# Patient Record
Sex: Male | Born: 1955 | Race: Asian | Hispanic: No | Marital: Married | Smoking: Never smoker
Health system: Southern US, Community
[De-identification: ages and names within clinical notes are randomized; demographics above are authoritative.]

## PROBLEM LIST (undated history)

## (undated) DIAGNOSIS — J45909 Unspecified asthma, uncomplicated: Secondary | ICD-10-CM

## (undated) DIAGNOSIS — E119 Type 2 diabetes mellitus without complications: Secondary | ICD-10-CM

## (undated) DIAGNOSIS — N200 Calculus of kidney: Secondary | ICD-10-CM

## (undated) HISTORY — PX: OTHER SURGICAL HISTORY: SHX169

## (undated) HISTORY — DX: Unspecified asthma, uncomplicated: J45.909

---

## 2000-12-21 ENCOUNTER — Encounter: Payer: Self-pay | Admitting: Family Medicine

## 2000-12-21 ENCOUNTER — Ambulatory Visit (HOSPITAL_COMMUNITY): Admission: RE | Admit: 2000-12-21 | Discharge: 2000-12-21 | Payer: Self-pay | Admitting: Family Medicine

## 2003-09-23 ENCOUNTER — Emergency Department (HOSPITAL_COMMUNITY): Admission: EM | Admit: 2003-09-23 | Discharge: 2003-09-23 | Payer: Self-pay | Admitting: Emergency Medicine

## 2004-08-19 ENCOUNTER — Ambulatory Visit (HOSPITAL_COMMUNITY): Admission: RE | Admit: 2004-08-19 | Discharge: 2004-08-19 | Payer: Self-pay | Admitting: Orthopaedic Surgery

## 2008-08-13 ENCOUNTER — Ambulatory Visit (HOSPITAL_COMMUNITY): Admission: RE | Admit: 2008-08-13 | Discharge: 2008-08-13 | Payer: Self-pay | Admitting: Family Medicine

## 2010-12-31 NOTE — H&P (Signed)
NAMEHILARY, Rodney Meyers              ACCOUNT NO.:  [id number]   MEDICAL RECORD NO.:  [id number]           PATIENT TYPE:   LOCATION:                                FACILITY:  [facility]   PHYSICIAN:  J. Darreld Mclean, M.D.      DATE OF BIRTH:   DATE OF ADMISSION:  DATE OF DISCHARGE:  LH                                HISTORY & PHYSICAL   CHIEF COMPLAINT:  Carpal tunnel syndrome, left hand.   HISTORY OF PRESENT ILLNESS:  The patient is a 55 year old Pakistani who has  carpal tunnel syndrome in his left hand.  He was first seen by Dr. Nobie Putnam,  in November, because of complaints of numbness and tingling in his left  hand.  It was felt by Dr. Nobie Putnam that he had a carpal tunnel syndrome.  He  was then referred to Dr. Hilda Lias for further evaluation of this complaint.  The patient eventually underwent nerve conduction lysis at Dr. 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  He had also mild median neuropathy at the wrist  on the right.  The patient has tried a cock-up splint, antiinflammatory  medication.  This failed to improve with conservative measures.  He now  desires surgery.   PAST MEDICAL HISTORY:  Irritable bowel syndrome.  He denies diabetes  mellitus, stroke, cardiac or respiratory problems.   OPERATIONS:  1.  Repair of fistula and hemorrhoidectomy in 1986, [facility].  2.  Release of lateral __________, left elbow, 1996, Dr. Hilda Lias.   MEDICATIONS:  Mucinex one tab daily occasionally for upper respiratory  infection.   ALLERGIES:  None.   LOCAL MEDICAL DOCTOR:  Patrica Duel, M.D.   FAMILY AND SOCIAL HISTORY:  The patient works at [employer].  He has a  high school education plus two years of college.  He does not use alcoholic  beverages.  He smokes tobacco.  He is single.   REVIEW OF SYSTEMS:  Noncontributory to the problem.   PHYSICAL EXAMINATION:  VITAL SIGNS:  The patient  is 5 foot and 7 inches tall  and weighs 175 pounds.  He is alert and oriented x 3.  VITAL SIGNS:  Temp is 98.6, pulse 68, respirations 16, blood pressure  110/72.  HEENT:  Within normal limits.  NECK:  Supple.  No thyromegaly or masses palpated.  LUNGS:  Clear to A&P.  HEART:  Regular rhythm without murmur.  No cardiomegaly.  ABDOMEN:  Slightly protuberant, soft, and nontender.  No organomegaly or  masses palpated.  Hypoactive bowel sounds auscultated.  EXTREMITIES:  The left hand has a positive Tinel and phalanx signs.  He had  full range of motion of the wrist and hands.  Neurovascular is intact.  Examination of the right wrist revealed full range of motion, is less  symptomatic.  Moves left hand.  He had a negative Tinel and phalanx signs in  his right hand.  Neurovascular is intact.  Other extremities normal limits.  Neurovascular is intact  to them.  CNS:  Intact.  SKIN:  Intact.   IMPRESSION:  1.  Bilateral carpal tunnel syndrome, left greater than right.  2.  History of irritable bowel syndrome.   PLAN:  The patient is to undergo carpal tunnel release of the left hand on  August 04, 2004.   LABS:  Pending.      BC/MEDQ  D:  07/27/2004  T:  07/27/2004  Job:  [id number]   cc:   Patrica Duel, M.D.  [address]  [place]  [place] [address]  Fax: [phone]

## 2010-12-31 NOTE — Op Note (Signed)
NAME:  Rodney Meyers, Rodney Meyers              ACCOUNT NO.:  [id number]   MEDICAL RECORD NO.:  [id number]          PATIENT TYPE:  AMB   LOCATION:  DAY                           FACILITY:  [facility]   PHYSICIAN:  J. Darreld Mclean, M.D. DATE OF BIRTH:  01-03-1956   DATE OF PROCEDURE:  08/19/2004  DATE OF DISCHARGE:                                 OPERATIVE REPORT   PREOPERATIVE DIAGNOSIS:  Carpal tunnel syndrome, left.   POSTOPERATIVE DIAGNOSIS:  Carpal tunnel syndrome, left.   OPERATION PERFORMED:  Left carpal tunnel release.   SURGEON:  J. Darreld Mclean, M.D.   ANESTHESIA:  Bier block.   TOURNIQUET TIME:  Please refer to anesthesia record.  Volar plaster splint applied.   DRAINS:  None.   Specimen sent.   INDICATIONS FOR PROCEDURE:  The patient had pain and tenderness in the  median nerve distribution.  Nerve conduction studies by Kofi A. Doonquah,  M.D. showed moderately severe carpal tunnel syndrome on the left with mild  median neuropathy on the right.  The patient has tried cock-up splints, anti-  inflammatories, conservative treatment was not successful.  He now desires  surgery.  The risks and imponderables  have been discussed.   DESCRIPTION OF PROCEDURE:  The patient was seen in the holding area.  We  identified the patient and the left wrist was the site of the surgical  procedure.  He placed a marker and I placed a marker.  He was brought back  to the operating room and was given Bier block anesthesia.  He was prepped  and draped in the usual manner.  We again re-identified the patient and  identified surgical site with a time out.  __________ incision made over the  volar wrist.  Median nerve was identified proximally.  Vessel loop placed  around the nerve.  A groove director then placed with the carpal tunnel  space and the volar carpal ligament was incised.  Specimen volar carpal  ligament was sent to pathology.  Retinaculum was cut dorsally.  Nerve was  markedly compressed.   A linear lysis epineurotomy carried out.  Wound  reinspected, nerve reinspected, no apparent injury.  Wound was  reapproximated with 3-0 nylon in interrupted vertical mattress manner.  Sterile dressing applied.  Sheet cotton applied, sheet cotton cut dorsally.  Volar plaster splint applied.  Ace bandage applied loosely.  The patient  tolerated the procedure well.  Bier block was discontinued in the usual  fashion.  Please refer to anesthesia record for tourniquet time.   Patient given prescription for Vicodin ES for pain and will him in the  office in approximately 10 days to two weeks.  Any difficulty, contact me  through the office hospital beeper system.  Numbers have been provided.      JWK/MEDQ  D:  08/19/2004  T:  08/19/2004  Job:  [id number]

## 2012-12-14 ENCOUNTER — Ambulatory Visit (INDEPENDENT_AMBULATORY_CARE_PROVIDER_SITE_OTHER): Payer: Managed Care, Other (non HMO) | Admitting: Family Medicine

## 2012-12-14 ENCOUNTER — Encounter: Payer: Self-pay | Admitting: Family Medicine

## 2012-12-14 VITALS — BP 128/70 | Temp 98.3°F | Wt 179.0 lb

## 2012-12-14 DIAGNOSIS — J322 Chronic ethmoidal sinusitis: Secondary | ICD-10-CM

## 2012-12-14 DIAGNOSIS — J45909 Unspecified asthma, uncomplicated: Secondary | ICD-10-CM

## 2012-12-14 MED ORDER — AMOXICILLIN-POT CLAVULANATE 875-125 MG PO TABS: 1.0000 | ORAL_TABLET | Freq: Two times a day (BID) | ORAL | Status: AC

## 2012-12-14 NOTE — Progress Notes (Signed)
  Subjective:    Patient ID: Rodney Meyers, male    DOB: September 02, 1955, 57 y.o.   MRN: [id number]  HPI Patient arrives office feeling poorly. Frontal headache. Persistent in nature. Nasal congestion. States he does not get allergies much. This has not flared up his asthma. Unresponsive to prior headaches. Feels like it never completely went away. A lot of stress with son recently diagnosed with leukemia   Review of Systems No rash, no vomiting, no diarrhea, no chest pain no abdominal pain ROS otherwise negative    Objective:   Physical Exam  Alert no acute distress. Lungs clear. Heart regular rate and rhythm. HEENT moderate nasal and sinus congestion. Frontal tenderness. Pharynx normal.      Assessment & Plan:  Impression persistent sinusitis-discussed. Plan Augmentin twice a day 20 one full days. Symptomatic care discussed. WSL

## 2012-12-16 DIAGNOSIS — J45909 Unspecified asthma, uncomplicated: Secondary | ICD-10-CM | POA: Insufficient documentation

## 2012-12-18 ENCOUNTER — Encounter: Payer: Self-pay | Admitting: *Deleted

## 2013-07-04 ENCOUNTER — Telehealth: Payer: Self-pay | Admitting: Family Medicine

## 2013-07-04 NOTE — Telephone Encounter (Signed)
PT was seen by his work Charity fundraiser (see attached note on chart), she feels he may have an issue with diabetes. Pt wants further testing and your advice on this matter. He has had excessive thirst, urination, blurry vision. No night thirst, no night sweats, etc.    Contact patient to let him know if he needs just fasting blood work or an appt. Thanks

## 2013-07-04 NOTE — Telephone Encounter (Signed)
Needs ov plus hbA1c next wk, fri ok, cut glucose down in the mean time

## 2013-07-05 NOTE — Telephone Encounter (Signed)
Notified patient's wife needs ov plus hbA1c next wk, fri ok, cut glucose down in the mean time. Transferred her to front desk to schedule appointment for patient.

## 2013-07-12 ENCOUNTER — Encounter: Payer: Self-pay | Admitting: Family Medicine

## 2013-07-12 ENCOUNTER — Ambulatory Visit (INDEPENDENT_AMBULATORY_CARE_PROVIDER_SITE_OTHER): Payer: Managed Care, Other (non HMO) | Admitting: Family Medicine

## 2013-07-12 VITALS — BP 146/80 | Ht 67.0 in | Wt 179.8 lb

## 2013-07-12 DIAGNOSIS — Z23 Encounter for immunization: Secondary | ICD-10-CM

## 2013-07-12 DIAGNOSIS — Z125 Encounter for screening for malignant neoplasm of prostate: Secondary | ICD-10-CM

## 2013-07-12 DIAGNOSIS — Z0000 Encounter for general adult medical examination without abnormal findings: Secondary | ICD-10-CM

## 2013-07-12 DIAGNOSIS — R739 Hyperglycemia, unspecified: Secondary | ICD-10-CM

## 2013-07-12 DIAGNOSIS — R7309 Other abnormal glucose: Secondary | ICD-10-CM

## 2013-07-12 DIAGNOSIS — E119 Type 2 diabetes mellitus without complications: Secondary | ICD-10-CM

## 2013-07-12 LAB — POCT GLYCOSYLATED HEMOGLOBIN (HGB A1C): Hemoglobin A1C: 10.2

## 2013-07-12 MED ORDER — METFORMIN HCL 500 MG PO TABS: ORAL_TABLET | ORAL | Status: DC

## 2013-07-12 NOTE — Progress Notes (Signed)
   Subjective:    Patient ID: Rodney Meyers, male    DOB: Aug 05, 1956, 57 y.o.   MRN: [id number]  HPI PT was seen by his work RN she feels he may have an issue with diabetes. His blood sugar was 252 (fasting)  Pt wants further testing and your advice on this matter. He has had excessive thirst, urination, blurry vision (in the past). No night thirst, no night sweats, etc.  Staying busy with   freq urination  Was eating too much candy, Soft drinks with sugar in them,  There is no family history diabetes.  Recently patient has had tremendous stress with son diagnosed as having leukemia and also long hours at work.  Patient some visual changes when got glasses several months ago.  Admits to not exercising. Realizes that he is gaining too much weight.  History of elevated blood pressure in the past. History of asthma clinically stable at this time.  Review of Systems Possible slight weight loss. Increased urinary frequency no shortness breath no headache no chest pain no abdominal pain no change in bowel habits ROS otherwise negative    Objective:   Physical Exam Alert HEENT normal. Blood pressure 1:30 or 78 on repeat. Lungs clear. Heart regular in rhythm. Abdomen benign. Ankles without edema.  See diabetic foot exam     Assessment & Plan:  Impression new onset type 2 diabetes discussed at great length. A1c elevated. Results for orders placed in visit on 07/12/13  POCT GLYCOSYLATED HEMOGLOBIN (HGB A1C)      Result Value Range   Hemoglobin A1C 10.2     Nature of diabetes discussed at great length. Long-term complications discussed. Need for intervention discussed. Plan glucometer prescribed. Check glucoses every morning. Metformin 500 twice a day for 1 week then 2 twice a day. Diet discussed length. Educational information given. Exercise discussed in encourage to length. Educational session the hospital discussed in encourage. Blood pressure parameters lipid parameters  discussed. Long-term risk of heart disease discussed. Side effects of medication discussed. 40 minutes spent with patient most in discussion. Recheck in a little over one month. WSL

## 2013-07-13 DIAGNOSIS — E119 Type 2 diabetes mellitus without complications: Secondary | ICD-10-CM | POA: Insufficient documentation

## 2013-07-15 DIAGNOSIS — Z0289 Encounter for other administrative examinations: Secondary | ICD-10-CM

## 2013-07-17 LAB — MICROALBUMIN, URINE: Microalb, Ur: 1.98 mg/dL — ABNORMAL HIGH (ref 0.00–1.89)

## 2013-07-17 LAB — LIPID PANEL
Cholesterol: 196 mg/dL (ref 0–200)
HDL: 36 mg/dL — ABNORMAL LOW (ref >39)
LDL Cholesterol: 124 mg/dL — ABNORMAL HIGH (ref 0–99)
Total CHOL/HDL Ratio: 5.4 Ratio
Triglycerides: 179 mg/dL — ABNORMAL HIGH (ref <150)
VLDL: 36 mg/dL (ref 0–40)

## 2013-07-17 LAB — HEPATIC FUNCTION PANEL
ALT: 30 U/L (ref 0–53)
AST: 28 U/L (ref 0–37)
Albumin: 4.6 g/dL (ref 3.5–5.2)
Alkaline Phosphatase: 87 U/L (ref 39–117)
Bilirubin, Direct: 0.1 mg/dL (ref 0.0–0.3)
Indirect Bilirubin: 0.5 mg/dL (ref 0.0–0.9)
Total Bilirubin: 0.6 mg/dL (ref 0.3–1.2)
Total Protein: 7.5 g/dL (ref 6.0–8.3)

## 2013-07-17 LAB — BASIC METABOLIC PANEL
BUN: 18 mg/dL (ref 6–23)
CO2: 25 mEq/L (ref 19–32)
Calcium: 9.4 mg/dL (ref 8.4–10.5)
Chloride: 102 mEq/L (ref 96–112)
Creat: 1.17 mg/dL (ref 0.50–1.35)
Glucose, Bld: 155 mg/dL — ABNORMAL HIGH (ref 70–99)
Potassium: 4.3 mEq/L (ref 3.5–5.3)
Sodium: 137 mEq/L (ref 135–145)

## 2013-07-17 LAB — PSA: PSA: 0.56 ng/mL (ref <=4.00)

## 2013-07-19 ENCOUNTER — Telehealth: Payer: Self-pay | Admitting: Family Medicine

## 2013-07-19 NOTE — Telephone Encounter (Signed)
Discussed with wife. She will call back next week to schedule an appt.

## 2013-07-19 NOTE — Telephone Encounter (Signed)
Kid and liv function fine . psa good. chol too high will discuss further at f u visit

## 2013-07-19 NOTE — Telephone Encounter (Signed)
Patient needs results to bloodwork

## 2013-07-22 ENCOUNTER — Telehealth: Payer: Self-pay | Admitting: Family Medicine

## 2013-07-22 NOTE — Telephone Encounter (Signed)
Patient is having issues with newly prescribed metformin. He is urinating a lot and wife describes "loss of motion". Please advise.

## 2013-07-22 NOTE — Telephone Encounter (Signed)
Metformin does not caswue "loss of motion". Urinating fast because sugars are too high, this med does not cause incr urination. incr urination comes because glucose when too high spills out the kidneys and takes water with it. rec ov later this wk and stay on same med

## 2013-07-22 NOTE — Telephone Encounter (Signed)
Notified patient and transferred up front for appt. 

## 2013-07-23 ENCOUNTER — Encounter: Payer: Self-pay | Admitting: Family Medicine

## 2013-07-23 ENCOUNTER — Ambulatory Visit (INDEPENDENT_AMBULATORY_CARE_PROVIDER_SITE_OTHER): Payer: Managed Care, Other (non HMO) | Admitting: Family Medicine

## 2013-07-23 VITALS — BP 122/84 | Temp 98.0°F | Ht 67.0 in | Wt 178.5 lb

## 2013-07-23 DIAGNOSIS — E785 Hyperlipidemia, unspecified: Secondary | ICD-10-CM

## 2013-07-23 DIAGNOSIS — K219 Gastro-esophageal reflux disease without esophagitis: Secondary | ICD-10-CM

## 2013-07-23 MED ORDER — METFORMIN HCL 500 MG PO TABS: ORAL_TABLET | ORAL | Status: DC

## 2013-07-23 MED ORDER — GLIPIZIDE 5 MG PO TABS: 5.0000 mg | ORAL_TABLET | ORAL | Status: DC

## 2013-07-23 NOTE — Patient Instructions (Signed)
Fasting sugar goal is 70 to 130 in the morning. After eating is not very helpful, it will be high as long as fasting numbers are good and we get the hgba1c back down to 7 we'll be in great control

## 2013-07-23 NOTE — Progress Notes (Signed)
   Subjective:    Patient ID: Rodney Meyers, male    DOB: [birth year], 57 y.o.   MRN: [id number]  Diarrhea  This is a new problem. The current episode started in the past 7 days. The problem has been unchanged. Associated symptoms include bloating. Associated symptoms comments: indigestion. Exacerbated by: metformin. There are no known risk factors. He has tried nothing for the symptoms. The treatment provided no relief.   Patient noted this occurring after he went to 2 metformin twice a day.  Sugar control is improving considerably. Most morning sugars are 120 to 1:30 on to twice a day.  Patient does have some mild abdominal cramping with the metformin.  Patient also notes considerable difficulty with reflux. He recently started taking Zantac 150 mg daily or twice daily as needed for reflux. He notes that this has improved his situation considerably. Patient having substernal burping belching sour taste in mouth.  Also wonders about results of blood work. See below.  Review of Systems  Gastrointestinal: Positive for diarrhea and bloating.   No chest pain no shortness of breath no blood in stools no weight loss weight gain, vision starting to worsen as glucose is improved ROS otherwise negative    Objective:   Physical Exam Alert HEENT normal. Lungs clear. Heart regular in rhythm. Ankles without edema sensation intact pulses good. Abdominal exam no discrete tenderness good bowel sounds no rebound no guarding       Assessment & Plan:  Impression #1 type 2 diabetes unfortunately side effects from current medication regimen discussed at length. #2 hyperlipidemia see blood work results 2 hyper patient discuss patient to work on diet. #3 reflux. Discussed would prefer to stick with Zantac if that works for him. #4 diarrhea secondary to to metformin twice a day plan add glipizide 5 every morning. Increase metformin to 1 twice a day rationale discussed. Do not skip meals. Hypoglycemia  reviewed. Add ranitidine twice a day. Followup as scheduled in 6 weeks. WSL

## 2013-07-24 DIAGNOSIS — K219 Gastro-esophageal reflux disease without esophagitis: Secondary | ICD-10-CM | POA: Insufficient documentation

## 2013-07-24 DIAGNOSIS — E782 Mixed hyperlipidemia: Secondary | ICD-10-CM | POA: Insufficient documentation

## 2013-07-24 DIAGNOSIS — E785 Hyperlipidemia, unspecified: Secondary | ICD-10-CM | POA: Insufficient documentation

## 2013-07-25 ENCOUNTER — Telehealth: Payer: Self-pay | Admitting: Family Medicine

## 2013-07-25 NOTE — Telephone Encounter (Signed)
Patient needing paper work. They were sent back 12/1 for signature . They havent came back to me yet.

## 2013-08-21 ENCOUNTER — Ambulatory Visit: Payer: Managed Care, Other (non HMO) | Admitting: Family Medicine

## 2013-09-03 ENCOUNTER — Encounter: Payer: Self-pay | Admitting: Family Medicine

## 2013-09-03 ENCOUNTER — Ambulatory Visit (INDEPENDENT_AMBULATORY_CARE_PROVIDER_SITE_OTHER): Payer: Managed Care, Other (non HMO) | Admitting: Family Medicine

## 2013-09-03 VITALS — BP 112/74 | Ht 67.0 in | Wt 175.0 lb

## 2013-09-03 DIAGNOSIS — J45909 Unspecified asthma, uncomplicated: Secondary | ICD-10-CM

## 2013-09-03 DIAGNOSIS — E119 Type 2 diabetes mellitus without complications: Secondary | ICD-10-CM

## 2013-09-03 LAB — POCT GLYCOSYLATED HEMOGLOBIN (HGB A1C): Hemoglobin A1C: 7.7

## 2013-09-03 MED ORDER — FLUTICASONE PROPIONATE HFA 110 MCG/ACT IN AERO: 2.0000 | INHALATION_SPRAY | Freq: Two times a day (BID) | RESPIRATORY_TRACT | Status: DC

## 2013-09-03 MED ORDER — METFORMIN HCL 1000 MG PO TABS: ORAL_TABLET | ORAL | Status: DC

## 2013-09-03 NOTE — Progress Notes (Signed)
   Subjective:    Patient ID: Raynelle JanSaif U Frosch, male    DOB: 01/06/1956, 58 y.o.   MRN: [id number]  HPIDiabetes check up. Pt states fasting blood sugars running between 84 - 120. A1C today 7.7.  Taking only one half tablet twice per day. Notes his sugars still generally in good control. Has noted some blurred vision as his sugars have improved.  Watching diet closely. Now exercising several times a week. Has lost 10 pounds.  No obvious side effects from the medication. Energy level overall considerably improved.   Having more difficulties with asthma. Notes wheezing multiple times every day. Takes 2 albuterol treatments per day on average.   Review of Systems No chest pain no back pain no vomiting no change in bowel habits no blood in stool no rash ROS otherwise negative    Objective:   Physical Exam Alert HEENT slight nasal congestion neck supple. Lungs bilateral wheezes mild no tachypnea heart regular rate and rhythm. Ankles without edema.       Assessment & Plan:  Impression type 2 diabetes control much improved. #2 asthma concerning for chronic persistent symptomatology. Discussed at length. We will initiate preventive agent. Plan start Flovent 110 per puff 2 puffs twice a day. Maintain metformin. Patient has stopped her Glucotrol stay off. Diet exercise discussed recheck here in 4 months. WSL

## 2014-04-09 ENCOUNTER — Telehealth: Payer: Self-pay | Admitting: Family Medicine

## 2014-04-09 MED ORDER — METFORMIN HCL 1000 MG PO TABS: ORAL_TABLET | ORAL | Status: DC

## 2014-04-09 NOTE — Telephone Encounter (Signed)
Send in 30 day supply to pharmacy. Patient needs office visit. Patient was transferred to front desk to schedule appointment.

## 2014-04-09 NOTE — Telephone Encounter (Signed)
Needs 90 day Rx for his metFORMIN (GLUCOPHAGE) 1000 MG tablet [facility] (changing pharmacies due to new insurance) Please call pt when done

## 2014-04-23 ENCOUNTER — Ambulatory Visit (INDEPENDENT_AMBULATORY_CARE_PROVIDER_SITE_OTHER): Payer: BC Managed Care – PPO | Admitting: Family Medicine

## 2014-04-23 ENCOUNTER — Encounter: Payer: Self-pay | Admitting: Family Medicine

## 2014-04-23 VITALS — BP 122/80 | Ht 67.0 in | Wt 175.0 lb

## 2014-04-23 DIAGNOSIS — E118 Type 2 diabetes mellitus with unspecified complications: Secondary | ICD-10-CM

## 2014-04-23 LAB — POCT GLYCOSYLATED HEMOGLOBIN (HGB A1C): HEMOGLOBIN A1C: 6.6

## 2014-04-23 MED ORDER — METFORMIN HCL 1000 MG PO TABS: ORAL_TABLET | ORAL | Status: DC

## 2014-04-23 MED ORDER — FLUTICASONE PROPIONATE HFA 110 MCG/ACT IN AERO: 2.0000 | INHALATION_SPRAY | Freq: Two times a day (BID) | RESPIRATORY_TRACT | Status: DC

## 2014-04-23 NOTE — Patient Instructions (Signed)
mylanta one or two tablespoons as needed for heartburn

## 2014-04-23 NOTE — Progress Notes (Signed)
   Subjective:    Patient ID: Rodney Meyers, male    DOB: 04-13-1956, 58 y.o.   MRN: [id number]  Diabetes He presents for his follow-up diabetic visit. He has type 2 diabetes mellitus. Current diabetic treatment includes oral agent (monotherapy). He is compliant with treatment all of the time. He sees a podiatrist.Eye exam is current.   Concerns about reflux. Twenty min or so, goes away and doen. Usually after certain meals. Accompanied by belching and bad taste in throat.  Glu numbers many low 100s. No low sugar spells.  Mostly trying to watch his diet.  Compliant with the preventive asthma medicine. The often uses Flovent only once per day.  Watching sweets   exercise not exercising because back acting up  Has not seen an eye doc yet this yr   Review of Systems No headache no chest pain no back pain no abdominal pain no change in bowel habits no blood in stool ROS otherwise negative    Objective:   Physical Exam Alert no apparent distress. Lungs clear. Heart regular in rhythm H&T normal. Ankles edema. C. diabetic foot exam.       Assessment & Plan:  Impression 1 type 2 diabetes good control.this Results for orders placed in visit on 04/23/14  POCT GLYCOSYLATED HEMOGLOBIN (HGB A1C)      Result Value Ref Range   Hemoglobin A1C 6.6     #2 asthma clinically stable. #3 reflux discussed plan Maalox when necessary. Diet exercise discussed. Encouraged to see eye doctor. Recheck in 6 months. Blood work then. Flu vaccine encourage. WSL

## 2014-05-02 ENCOUNTER — Telehealth: Payer: Self-pay | Admitting: Family Medicine

## 2014-05-02 NOTE — Telephone Encounter (Signed)
Pt's FLOVENT was DENIED due to criteria not being met, please see denial in green folder, pt must try & fail Asmanex or Qvar, please advise

## 2014-05-06 MED ORDER — BECLOMETHASONE DIPROPIONATE 40 MCG/ACT IN AERS: 2.0000 | INHALATION_SPRAY | Freq: Two times a day (BID) | RESPIRATORY_TRACT | Status: DC

## 2014-05-06 NOTE — Telephone Encounter (Signed)
Received another prio auth request, this has already been denied, please see note below, please advise

## 2014-05-06 NOTE — Telephone Encounter (Signed)
qvar 40 mcg per puff two puffs bid

## 2014-05-06 NOTE — Telephone Encounter (Signed)
Medication sent to pharmacy  

## 2014-05-09 ENCOUNTER — Telehealth: Payer: Self-pay | Admitting: Family Medicine

## 2014-05-09 NOTE — Telephone Encounter (Signed)
[facility] said [facility] [place] [facility] and notified him that our office needed to contact them in order for him to have his medications filled. Please advise.

## 2014-05-09 NOTE — Telephone Encounter (Signed)
Notified patient that medications were sent to pharmacy earlier this month with 5 additional refills.

## 2014-05-29 ENCOUNTER — Other Ambulatory Visit: Payer: Self-pay | Admitting: *Deleted

## 2014-05-29 ENCOUNTER — Telehealth: Payer: Self-pay | Admitting: Family Medicine

## 2014-05-29 MED ORDER — METFORMIN HCL 1000 MG PO TABS: ORAL_TABLET | ORAL | Status: DC

## 2014-05-29 NOTE — Telephone Encounter (Signed)
Med sent to pharm. Pt's wife notified. Pt has not had bloodwork done here since last December. Wife to drop off bloodwork that he had done 2 weeks ago.

## 2014-05-29 NOTE — Telephone Encounter (Signed)
ok 

## 2014-05-29 NOTE — Telephone Encounter (Signed)
Patient needs Rx for metFORMIN (GLUCOPHAGE) 1000 MG tablet to [facility] [place].  Also, patients spouse said that he had a cholesterol 2 weeks ago with Dr. Brett CanalesSteve and wanted to check if any medicine needed to be called in.

## 2014-06-04 ENCOUNTER — Telehealth: Payer: Self-pay | Admitting: Family Medicine

## 2014-06-04 NOTE — Telephone Encounter (Signed)
See chart for BW from his place of employment

## 2014-06-06 NOTE — Telephone Encounter (Signed)
Pt is calling to see if you looked over this report an does he need an appt or be put on any meds

## 2014-06-09 ENCOUNTER — Other Ambulatory Visit: Payer: Self-pay | Admitting: *Deleted

## 2014-06-09 DIAGNOSIS — E785 Hyperlipidemia, unspecified: Secondary | ICD-10-CM

## 2014-06-09 DIAGNOSIS — Z79899 Other long term (current) drug therapy: Secondary | ICD-10-CM

## 2014-06-09 MED ORDER — PRAVASTATIN SODIUM 20 MG PO TABS: 20.0000 mg | ORAL_TABLET | Freq: Every day | ORAL | Status: DC

## 2014-06-09 NOTE — Telephone Encounter (Signed)
pravochol 20 mg numb thirty five ref , will re ck bw after three mo on therapy and then every six mo after that

## 2014-06-09 NOTE — Telephone Encounter (Signed)
Calling again to check on this. Please advise.

## 2014-06-09 NOTE — Telephone Encounter (Signed)
Discussed with patient. Patient states he has been working on diet and exercise.pt states Lipid was high last year and he wants to go ahead and start on meds now if you will prescribed.

## 2014-06-09 NOTE — Telephone Encounter (Signed)
Call pt, kid liv thyr and psa all good, lipids not good needs to work harder on fats in the diet, re ck this agin in 6 mo, if not sig better will initiate meds. No need for another visit at this time

## 2014-06-09 NOTE — Telephone Encounter (Signed)
Discussed with pt. Med [facility] to [facility] [place]. Orders in for lipid, liver in 3 months.

## 2014-06-18 ENCOUNTER — Ambulatory Visit (INDEPENDENT_AMBULATORY_CARE_PROVIDER_SITE_OTHER): Payer: BC Managed Care – PPO | Admitting: Family Medicine

## 2014-06-18 ENCOUNTER — Encounter: Payer: Self-pay | Admitting: Family Medicine

## 2014-06-18 VITALS — BP 144/90 | Temp 97.6°F | Ht 67.0 in | Wt 177.0 lb

## 2014-06-18 DIAGNOSIS — L03211 Cellulitis of face: Secondary | ICD-10-CM

## 2014-06-18 DIAGNOSIS — L0201 Cutaneous abscess of face: Secondary | ICD-10-CM

## 2014-06-18 MED ORDER — MOMETASONE FUROATE 0.1 % EX SOLN: Freq: Every day | CUTANEOUS | Status: DC

## 2014-06-18 MED ORDER — AMOXICILLIN-POT CLAVULANATE 875-125 MG PO TABS: 1.0000 | ORAL_TABLET | Freq: Two times a day (BID) | ORAL | Status: AC

## 2014-06-18 MED ORDER — OFLOXACIN 0.3 % OT SOLN: 5.0000 [drp] | Freq: Two times a day (BID) | OTIC | Status: DC

## 2014-06-18 NOTE — Progress Notes (Signed)
   Subjective:    Patient ID: Rodney Meyers, male    DOB: May 08, 1956, 58 y.o.   MRN: [id number]  HPI Patient is here today d/t right, ear pain.started rather suddenly  Hurts ear lobe a lot  Patient wears earplugs chronically at work and often develops an irritated itchy area and right external ear canal.   This started on Friday.  He said he has red drainage.  No other s/s. No tx.     Review of Systems No headache no cough no sore throat no chest pain no fever ROS otherwise negative    Objective:   Physical Exam Alert slight distress right earlobe noticeably swollen tender chronic eczema changes outer canal with cracking skin tympanic membrane normal pharynx neck lungs heart all normal       Assessment & Plan:  Impression cellulitis with underlying eczema plan topical and oral antibiotics initially. Elocon lotion long-term discussed WSL

## 2014-06-24 ENCOUNTER — Encounter: Payer: Self-pay | Admitting: Family Medicine

## 2014-06-24 ENCOUNTER — Ambulatory Visit (INDEPENDENT_AMBULATORY_CARE_PROVIDER_SITE_OTHER): Payer: BC Managed Care – PPO | Admitting: Family Medicine

## 2014-06-24 VITALS — BP 130/82 | Ht 67.0 in | Wt 177.0 lb

## 2014-06-24 DIAGNOSIS — H9201 Otalgia, right ear: Secondary | ICD-10-CM

## 2014-06-24 DIAGNOSIS — H6011 Cellulitis of right external ear: Secondary | ICD-10-CM

## 2014-06-24 MED ORDER — DOXYCYCLINE HYCLATE 100 MG PO TABS: 100.0000 mg | ORAL_TABLET | Freq: Two times a day (BID) | ORAL | Status: DC

## 2014-06-24 MED ORDER — ALBUTEROL SULFATE (2.5 MG/3ML) 0.083% IN NEBU: 2.5000 mg | INHALATION_SOLUTION | Freq: Every evening | RESPIRATORY_TRACT | Status: DC | PRN

## 2014-06-24 NOTE — Progress Notes (Signed)
   Subjective:    Patient ID: Rodney Meyers, male    DOB: 07/14/1956, 58 y.o.   MRN: [id number]  Otalgia  The current episode started in the past 7 days. Associated symptoms comments: Wheezing . Treatments tried: augmentin.   See prior note. Ongoing challenge. Ear now more swollen. Inflamed. Next  No obvious fever or chills. Next  Slight discharge. Compliant with medications.   Review of Systems  HENT: Positive for ear pain.    No cough no headache    Objective:   Physical Exam  Alert mild malaise. Right ear swollen. Tender pruritic or notes. External canal swollen lungs clear. Heart regular in rhythm. Vitals stable.      Assessment & Plan:  Impression progressive cellulitis plan stop Augmentin startdoxyculture wound. Local measures discussed. WSL

## 2014-06-27 LAB — WOUND CULTURE
Gram Stain: NONE SEEN
Gram Stain: NONE SEEN
Gram Stain: NONE SEEN

## 2014-07-11 ENCOUNTER — Telehealth: Payer: Self-pay | Admitting: Family Medicine

## 2014-07-11 MED ORDER — DOXYCYCLINE HYCLATE 100 MG PO TABS: 100.0000 mg | ORAL_TABLET | Freq: Two times a day (BID) | ORAL | Status: DC

## 2014-07-11 NOTE — Telephone Encounter (Signed)
Seen 11/4 and 11/10  Ear pain. Diagnosed with cellulitiis. Finished doxy. Much better but still having some pain. Can another round of doxy be called in.

## 2014-07-11 NOTE — Telephone Encounter (Signed)
yes

## 2014-07-11 NOTE — Telephone Encounter (Signed)
Notified wife medication has been sent to pharmacy.

## 2014-07-11 NOTE — Telephone Encounter (Signed)
Patients wife called today regarding his ear that he was being treated for.  She says that it is healed 90 % of the way, but she says that there is still "skin" in his ear that she sees and thinks he may need a refill on the doxycycline.   [facility]

## 2014-10-08 ENCOUNTER — Encounter: Payer: Self-pay | Admitting: Family Medicine

## 2014-10-08 ENCOUNTER — Ambulatory Visit (INDEPENDENT_AMBULATORY_CARE_PROVIDER_SITE_OTHER): Payer: BLUE CROSS/BLUE SHIELD | Admitting: Family Medicine

## 2014-10-08 VITALS — BP 112/74 | Temp 98.1°F | Ht 67.0 in | Wt 178.0 lb

## 2014-10-08 DIAGNOSIS — J329 Chronic sinusitis, unspecified: Secondary | ICD-10-CM

## 2014-10-08 DIAGNOSIS — E119 Type 2 diabetes mellitus without complications: Secondary | ICD-10-CM | POA: Diagnosis not present

## 2014-10-08 DIAGNOSIS — E785 Hyperlipidemia, unspecified: Secondary | ICD-10-CM

## 2014-10-08 DIAGNOSIS — Z79899 Other long term (current) drug therapy: Secondary | ICD-10-CM | POA: Diagnosis not present

## 2014-10-08 DIAGNOSIS — Z125 Encounter for screening for malignant neoplasm of prostate: Secondary | ICD-10-CM

## 2014-10-08 MED ORDER — AZITHROMYCIN 250 MG PO TABS: ORAL_TABLET | ORAL | Status: DC

## 2014-10-08 MED ORDER — OSELTAMIVIR PHOSPHATE 75 MG PO CAPS: 75.0000 mg | ORAL_CAPSULE | Freq: Two times a day (BID) | ORAL | Status: DC

## 2014-10-08 NOTE — Progress Notes (Signed)
   Subjective:    Patient ID: Rodney Meyers, male    DOB: [birth year], 57 y.o.   MRN: [id number]  Cough This is a new problem. The current episode started yesterday. The cough is productive of sputum. Associated symptoms include a sore throat. Nothing aggravates the symptoms. He has tried OTC cough suppressant (Tylenol) for the symptoms. The treatment provided mild relief. His past medical history is significant for asthma.   Headache frontal in nature nasal discharge intermittent cough   Review of Systems  HENT: Positive for sore throat.   Respiratory: Positive for cough.    No vomiting no diarrhea    Objective:   Physical Exam  Alert moderate malaise frontal maxillary tenderness pharynx mild erythema otherwise normal neck supple lungs clear heart rare rhythm      Assessment & Plan:  Impression acute rhinosinusitis with secondary pharyngitis plan antibiotics prescribed. Symptom care discussed. Warning signs discussed. WSL

## 2014-10-13 ENCOUNTER — Telehealth: Payer: Self-pay | Admitting: Family Medicine

## 2014-10-13 MED ORDER — AZITHROMYCIN 250 MG PO TABS
ORAL_TABLET | ORAL | Status: DC
Start: 2014-10-13 — End: 2014-11-25

## 2014-10-13 NOTE — Telephone Encounter (Signed)
Pt is requesting a refill on the azithromicin.

## 2014-10-13 NOTE — Telephone Encounter (Signed)
Patient said that he lost the prescription and cannot find it anywhere. I sent in a refill of the zpak.

## 2014-11-05 ENCOUNTER — Ambulatory Visit: Payer: BLUE CROSS/BLUE SHIELD | Admitting: Family Medicine

## 2014-11-21 ENCOUNTER — Telehealth: Payer: Self-pay | Admitting: Family Medicine

## 2014-11-21 DIAGNOSIS — E119 Type 2 diabetes mellitus without complications: Secondary | ICD-10-CM

## 2014-11-21 DIAGNOSIS — E785 Hyperlipidemia, unspecified: Secondary | ICD-10-CM

## 2014-11-21 DIAGNOSIS — Z125 Encounter for screening for malignant neoplasm of prostate: Secondary | ICD-10-CM

## 2014-11-21 DIAGNOSIS — Z79899 Other long term (current) drug therapy: Secondary | ICD-10-CM

## 2014-11-21 NOTE — Telephone Encounter (Signed)
Rep same 

## 2014-11-21 NOTE — Telephone Encounter (Signed)
Pt is requesting lab orders to be sent over for his upcoming appt on 11/25/14. Last labs per epic were: hepatic,psa,A1c,microalbumin,and lipid on 10/08/14

## 2014-11-21 NOTE — Telephone Encounter (Signed)
Notified wife that blood work has been ordered and [facility].

## 2014-11-24 LAB — HEPATIC FUNCTION PANEL
ALK PHOS: 84 IU/L (ref 39–117)
ALT: 23 IU/L (ref 0–44)
AST: 21 IU/L (ref 0–40)
Albumin: 4.7 g/dL (ref 3.5–5.5)
BILIRUBIN TOTAL: 0.3 mg/dL (ref 0.0–1.2)
Bilirubin, Direct: 0.1 mg/dL (ref 0.00–0.40)
Total Protein: 7.3 g/dL (ref 6.0–8.5)

## 2014-11-24 LAB — LIPID PANEL
Chol/HDL Ratio: 5.6 ratio — ABNORMAL HIGH (ref 0.0–5.0)
Cholesterol, Total: 228 mg/dL — ABNORMAL HIGH (ref 100–199)
HDL: 41 mg/dL
LDL Calculated: 141 mg/dL — ABNORMAL HIGH (ref 0–99)
Triglycerides: 230 mg/dL — ABNORMAL HIGH (ref 0–149)
VLDL Cholesterol Cal: 46 mg/dL — ABNORMAL HIGH (ref 5–40)

## 2014-11-24 LAB — HEMOGLOBIN A1C
Est. average glucose Bld gHb Est-mCnc: 148 mg/dL
Hgb A1c MFr Bld: 6.8 % — ABNORMAL HIGH (ref 4.8–5.6)

## 2014-11-24 LAB — MICROALBUMIN, URINE: Microalbumin, Urine: <3 ug/mL (ref 0.0–17.0)

## 2014-11-24 LAB — PSA: PSA: 1 ng/mL (ref 0.0–4.0)

## 2014-11-25 ENCOUNTER — Encounter: Payer: Self-pay | Admitting: Family Medicine

## 2014-11-25 ENCOUNTER — Ambulatory Visit (INDEPENDENT_AMBULATORY_CARE_PROVIDER_SITE_OTHER): Payer: BLUE CROSS/BLUE SHIELD | Admitting: Family Medicine

## 2014-11-25 VITALS — BP 110/80 | Ht 67.0 in | Wt 175.2 lb

## 2014-11-25 DIAGNOSIS — J4530 Mild persistent asthma, uncomplicated: Secondary | ICD-10-CM | POA: Diagnosis not present

## 2014-11-25 DIAGNOSIS — E119 Type 2 diabetes mellitus without complications: Secondary | ICD-10-CM

## 2014-11-25 DIAGNOSIS — E785 Hyperlipidemia, unspecified: Secondary | ICD-10-CM | POA: Diagnosis not present

## 2014-11-25 MED ORDER — BECLOMETHASONE DIPROPIONATE 40 MCG/ACT IN AERS: 2.0000 | INHALATION_SPRAY | Freq: Two times a day (BID) | RESPIRATORY_TRACT | Status: DC

## 2014-11-25 MED ORDER — PRAVASTATIN SODIUM 40 MG PO TABS: 40.0000 mg | ORAL_TABLET | Freq: Every day | ORAL | Status: DC

## 2014-11-25 MED ORDER — METFORMIN HCL 1000 MG PO TABS: ORAL_TABLET | ORAL | Status: DC

## 2014-11-25 NOTE — Progress Notes (Signed)
   Subjective:    Patient ID: Rodney Meyers, male    DOB: 12-29-1955, 59 y.o.   MRN: [id number]  Diabetes He presents for his follow-up diabetic visit. He has type 2 diabetes mellitus. His disease course has been stable. There are no hypoglycemic associated symptoms. There are no diabetic associated symptoms. There are no hypoglycemic complications. Symptoms are stable. There are no diabetic complications. There are no known risk factors for coronary artery disease. Current diabetic treatment includes oral agent (monotherapy). He is compliant with treatment all of the time.  Patient had A1C done with bloodwork. Results in the system. No other concerns today.   Results for orders placed or performed in visit on 11/21/14  Hepatic function panel  Result Value Ref Range   Total Protein 7.3 6.0 - 8.5 g/dL   Albumin 4.7 3.5 - 5.5 g/dL   Bilirubin Total 0.3 0.0 - 1.2 mg/dL   Bilirubin, Direct 7.820.10 0.00 - 0.40 mg/dL   Alkaline Phosphatase 84 39 - 117 IU/L   AST 21 0 - 40 IU/L   ALT 23 0 - 44 IU/L  PSA  Result Value Ref Range   PSA 1.0 0.0 - 4.0 ng/mL  Hemoglobin A1c  Result Value Ref Range   Hgb A1c MFr Bld 6.8 (H) 4.8 - 5.6 %   Est. average glucose Bld gHb Est-mCnc 148 mg/dL  Microalbumin, urine  Result Value Ref Range   Microalbum.,U,Random <3.0 0.0 - 17.0 ug/mL  Lipid panel  Result Value Ref Range   Cholesterol, Total 228 (H) 100 - 199 mg/dL   Triglycerides 956230 (H) 0 - 149 mg/dL   HDL 41 >21>39 mg/dL   VLDL Cholesterol Cal 46 (H) 5 - 40 mg/dL   LDL Calculated 308141 (H) 0 - 99 mg/dL   Chol/HDL Ratio 5.6 (H) 0.0 - 5.0 ratio units   Patient compliant with her lipid medicine. Claims does not miss a dose. Mostly watch fats in his diet.  Patient admits to some confusion on his inhaler. He was using albuterol only. He had plan to start the Qvar after the albuterol was finish. He did not seem familiar with the concept of steroid inhaler despite our discussion in the past  Review of  Systems No headache no chest pain no back pain no abdominal pain    Objective:   Physical Exam Alert vitals stable. HEENT normal. Blood pressure good on repeat. Lungs mild expiratory wheezes. Heart regular in rhythm. Ankles without edema.       Assessment & Plan:  Hyperlip, subopt impression #1 hyperlipidemia suboptimum control discussed at length #2 type 2 diabetes controlled good discuss #3 asthma suboptimal. Uses inhaler daily since therefore has persistent asthma. Importance of using steroid inhaler discussed plan increase Pravachol to 40 mg daily. Maintain same diabetes therapy. Diet exercise discussed. Immediately initiate Qvar and use a preventative fashion. Follow-up in 6 months. WSL

## 2014-12-02 ENCOUNTER — Telehealth: Payer: Self-pay | Admitting: Family Medicine

## 2014-12-02 NOTE — Telephone Encounter (Signed)
Pt would like to know what the results to the blood work done recently was  And would like a copy mailed to them

## 2014-12-02 NOTE — Telephone Encounter (Signed)
Send copy all disc at o v

## 2014-12-04 NOTE — Telephone Encounter (Signed)
Printed, mailed

## 2015-01-16 ENCOUNTER — Ambulatory Visit (INDEPENDENT_AMBULATORY_CARE_PROVIDER_SITE_OTHER): Payer: BLUE CROSS/BLUE SHIELD | Admitting: Nurse Practitioner

## 2015-01-16 ENCOUNTER — Encounter: Payer: Self-pay | Admitting: Nurse Practitioner

## 2015-01-16 VITALS — BP 110/80 | Temp 98.3°F | Ht 67.0 in | Wt 173.0 lb

## 2015-01-16 DIAGNOSIS — B9689 Other specified bacterial agents as the cause of diseases classified elsewhere: Secondary | ICD-10-CM

## 2015-01-16 DIAGNOSIS — J069 Acute upper respiratory infection, unspecified: Secondary | ICD-10-CM

## 2015-01-16 MED ORDER — AMOXICILLIN-POT CLAVULANATE 875-125 MG PO TABS: 1.0000 | ORAL_TABLET | Freq: Two times a day (BID) | ORAL | Status: DC

## 2015-01-16 MED ORDER — OSELTAMIVIR PHOSPHATE 75 MG PO CAPS: 75.0000 mg | ORAL_CAPSULE | Freq: Two times a day (BID) | ORAL | Status: DC

## 2015-01-19 ENCOUNTER — Encounter: Payer: Self-pay | Admitting: Nurse Practitioner

## 2015-01-19 NOTE — Progress Notes (Signed)
Subjective:  Presents for c/o sore throat, congestion and cough x 1 week. No fever. Green nasal drainage. Slight cough. Occasional wheeze but very mild. Using his inhaler up to twice a day. No ear pain or headache. No vomiting, diarrhea or abd pain.   Objective:   BP 110/80 mmHg  Temp(Src) 98.3 F (36.8 C) (Oral)  Ht 5\' 7"  (1.702 m)  Wt 173 lb (78.472 kg)  BMI 27.09 kg/m2 NAD. Alert, oriented. TMs clear effusion. Pharynx mildly injected with PND noted. Neck supple with mild anterior adenopathy. Lungs clear. Heart RRR.  Assessment: Bacterial upper respiratory infection  Plan:  Meds ordered this encounter  Medications  . amoxicillin-clavulanate (AUGMENTIN) 875-125 MG per tablet    Sig: Take 1 tablet by mouth 2 (two) times daily.    Dispense:  20 tablet    Refill:  0    Order Specific Question:  Supervising Provider    Answer:  Merlyn AlbertLUKING, WILLIAM S [[id number]]  . oseltamivir (TAMIFLU) 75 MG capsule    Sig: Take 1 capsule (75 mg total) by mouth 2 (two) times daily.    Dispense:  10 capsule    Refill:  0    Order Specific Question:  Supervising Provider    Answer:  Riccardo DubinLUKING, WILLIAM S [[id number]]   Requested an RX for Tamiflu just in case he needed it. His son has leukemia.  OTC meds as directed for cough and congestion.  Return if symptoms worsen or fail to improve.

## 2015-05-16 LAB — HM DIABETES EYE EXAM

## 2015-05-27 ENCOUNTER — Ambulatory Visit: Payer: BLUE CROSS/BLUE SHIELD | Admitting: Family Medicine

## 2015-08-11 ENCOUNTER — Telehealth: Payer: Self-pay | Admitting: Family Medicine

## 2015-08-11 ENCOUNTER — Other Ambulatory Visit: Payer: Self-pay | Admitting: *Deleted

## 2015-08-11 MED ORDER — SULFACETAMIDE SODIUM 10 % OP SOLN: OPHTHALMIC | Status: DC

## 2015-08-11 NOTE — Telephone Encounter (Signed)
Pt is requesting eye drops to be called in for pink eye.     [facility] [place]

## 2015-08-11 NOTE — Telephone Encounter (Signed)
Med sent to pharm. Pt notified.  

## 2015-10-01 ENCOUNTER — Other Ambulatory Visit (HOSPITAL_COMMUNITY): Admission: RE | Admit: 2015-10-01 | Payer: Self-pay | Source: Ambulatory Visit

## 2015-10-05 ENCOUNTER — Other Ambulatory Visit: Payer: Self-pay | Admitting: Family Medicine

## 2015-10-09 ENCOUNTER — Other Ambulatory Visit: Payer: Self-pay | Admitting: Family Medicine

## 2015-10-09 ENCOUNTER — Encounter: Payer: Self-pay | Admitting: Family Medicine

## 2015-10-09 ENCOUNTER — Ambulatory Visit (INDEPENDENT_AMBULATORY_CARE_PROVIDER_SITE_OTHER): Payer: BLUE CROSS/BLUE SHIELD | Admitting: Family Medicine

## 2015-10-09 DIAGNOSIS — J111 Influenza due to unidentified influenza virus with other respiratory manifestations: Secondary | ICD-10-CM

## 2015-10-09 DIAGNOSIS — J019 Acute sinusitis, unspecified: Secondary | ICD-10-CM | POA: Diagnosis not present

## 2015-10-09 MED ORDER — AZITHROMYCIN 250 MG PO TABS: ORAL_TABLET | ORAL | Status: DC

## 2015-10-09 MED ORDER — OSELTAMIVIR PHOSPHATE 75 MG PO CAPS: 75.0000 mg | ORAL_CAPSULE | Freq: Two times a day (BID) | ORAL | Status: DC

## 2015-10-09 NOTE — Progress Notes (Signed)
   Subjective:    Patient ID: Rodney Meyers, male    DOB: 06-28-1956, 60 y.o.   MRN: [id number]  Cough This is a new problem. The current episode started in the past 7 days. Associated symptoms include a fever and nasal congestion.   symptoms over the past couple days with fever headache body aches not feeling good low energy denies nausea vomiting diarrhea    Review of Systems  Constitutional: Positive for fever.  Respiratory: Positive for cough.    Relates low better runny nose sore throat Oddi aches feeling low energy.    Objective:   Physical Exam On examination eardrums normal throat is normal neck supple lungs clear no crackles no respiratory distress       Assessment & Plan:  Influenza-Influenza-the patient was diagnosed with influenza. Patient/family educated about the flu and warning signs to watch for. If difficulty breathing, severe neck pain and stiffness, cyanosis, disorientation, or progressive worsening then immediately get rechecked at that ER. If progressive symptoms be certain to be rechecked. Supportive measures such as Tylenol/ibuprofen was discussed. No aspirin use in children. And influenza home care instruction sheet was given. Also there could be some component of bronchitis patient requests a Z-Pak this was given to him

## 2015-10-09 NOTE — Patient Instructions (Signed)

## 2015-11-03 ENCOUNTER — Other Ambulatory Visit: Payer: Self-pay | Admitting: Family Medicine

## 2015-12-28 ENCOUNTER — Telehealth: Payer: Self-pay | Admitting: Family Medicine

## 2015-12-28 DIAGNOSIS — E119 Type 2 diabetes mellitus without complications: Secondary | ICD-10-CM

## 2015-12-28 DIAGNOSIS — Z125 Encounter for screening for malignant neoplasm of prostate: Secondary | ICD-10-CM

## 2015-12-28 DIAGNOSIS — Z79899 Other long term (current) drug therapy: Secondary | ICD-10-CM

## 2015-12-28 DIAGNOSIS — E785 Hyperlipidemia, unspecified: Secondary | ICD-10-CM

## 2015-12-28 NOTE — Telephone Encounter (Signed)
Pt is calling to get labs for up on 5/22  Last lab 11/22/14 Hep, PSA, A1C, Lip, MicroAlbumin, Urine

## 2015-12-28 NOTE — Telephone Encounter (Signed)
Rep same 

## 2015-12-28 NOTE — Telephone Encounter (Signed)
Blood work ordered in EPIC. Patient notified. 

## 2016-01-03 ENCOUNTER — Other Ambulatory Visit: Payer: Self-pay | Admitting: Family Medicine

## 2016-01-04 ENCOUNTER — Encounter: Payer: Self-pay | Admitting: Family Medicine

## 2016-01-04 ENCOUNTER — Ambulatory Visit (INDEPENDENT_AMBULATORY_CARE_PROVIDER_SITE_OTHER): Payer: BLUE CROSS/BLUE SHIELD | Admitting: Family Medicine

## 2016-01-04 VITALS — BP 110/80 | Ht 67.0 in | Wt 172.2 lb

## 2016-01-04 DIAGNOSIS — E785 Hyperlipidemia, unspecified: Secondary | ICD-10-CM | POA: Diagnosis not present

## 2016-01-04 DIAGNOSIS — Z0189 Encounter for other specified special examinations: Secondary | ICD-10-CM

## 2016-01-04 DIAGNOSIS — J4530 Mild persistent asthma, uncomplicated: Secondary | ICD-10-CM

## 2016-01-04 DIAGNOSIS — E119 Type 2 diabetes mellitus without complications: Secondary | ICD-10-CM

## 2016-01-04 DIAGNOSIS — Z0000 Encounter for general adult medical examination without abnormal findings: Secondary | ICD-10-CM

## 2016-01-04 LAB — LIPID PANEL
CHOL/HDL RATIO: 4.4 ratio (ref 0.0–5.0)
Cholesterol, Total: 199 mg/dL (ref 100–199)
HDL: 45 mg/dL (ref >39)
LDL Calculated: 122 mg/dL — ABNORMAL HIGH (ref 0–99)
TRIGLYCERIDES: 159 mg/dL — AB (ref 0–149)
VLDL CHOLESTEROL CAL: 32 mg/dL (ref 5–40)

## 2016-01-04 LAB — HEPATIC FUNCTION PANEL
ALBUMIN: 4.5 g/dL (ref 3.5–5.5)
ALK PHOS: 79 IU/L (ref 39–117)
ALT: 18 IU/L (ref 0–44)
AST: 19 IU/L (ref 0–40)
BILIRUBIN, DIRECT: 0.12 mg/dL (ref 0.00–0.40)
Bilirubin Total: 0.4 mg/dL (ref 0.0–1.2)
TOTAL PROTEIN: 7.5 g/dL (ref 6.0–8.5)

## 2016-01-04 LAB — MICROALBUMIN / CREATININE URINE RATIO
Creatinine, Urine: 145.2 mg/dL
MICROALB/CREAT RATIO: 7.6 mg/g creat (ref 0.0–30.0)
Microalbumin, Urine: 11 ug/mL

## 2016-01-04 LAB — HEMOGLOBIN A1C
Est. average glucose Bld gHb Est-mCnc: 137 mg/dL
Hgb A1c MFr Bld: 6.4 % — ABNORMAL HIGH (ref 4.8–5.6)

## 2016-01-04 LAB — PSA: PROSTATE SPECIFIC AG, SERUM: 0.6 ng/mL (ref 0.0–4.0)

## 2016-01-04 MED ORDER — ALBUTEROL SULFATE (2.5 MG/3ML) 0.083% IN NEBU: 2.5000 mg | INHALATION_SOLUTION | Freq: Every evening | RESPIRATORY_TRACT | Status: DC | PRN

## 2016-01-04 MED ORDER — METFORMIN HCL 1000 MG PO TABS: 500.0000 mg | ORAL_TABLET | Freq: Every day | ORAL | Status: DC

## 2016-01-04 MED ORDER — PRAVASTATIN SODIUM 40 MG PO TABS: 40.0000 mg | ORAL_TABLET | Freq: Every day | ORAL | Status: DC

## 2016-01-04 MED ORDER — BECLOMETHASONE DIPROPIONATE 40 MCG/ACT IN AERS: 2.0000 | INHALATION_SPRAY | Freq: Two times a day (BID) | RESPIRATORY_TRACT | Status: DC

## 2016-01-04 NOTE — Progress Notes (Signed)
Subjective:    Patient ID: Rodney Meyers, male    DOB: 1956-01-23, 60 y.o.   MRN: [id number]  HPI The patient comes in today for a wellness visit.  Results for orders placed or performed in visit on 12/28/15  Lipid panel  Result Value Ref Range   Cholesterol, Total 199 100 - 199 mg/dL   Triglycerides 409159 (H) 0 - 149 mg/dL   HDL 45 >81>39 mg/dL   VLDL Cholesterol Cal 32 5 - 40 mg/dL   LDL Calculated 191122 (H) 0 - 99 mg/dL   Chol/HDL Ratio 4.4 0.0 - 5.0 ratio units  Microalbumin / creatinine urine ratio  Result Value Ref Range   Creatinine, Urine 145.2 Not Estab. mg/dL   Microalbum.,U,Random 11.0 Not Estab. ug/mL   MICROALB/CREAT RATIO 7.6 0.0 - 30.0 mg/g creat  Hepatic function panel  Result Value Ref Range   Total Protein 7.5 6.0 - 8.5 g/dL   Albumin 4.5 3.5 - 5.5 g/dL   Bilirubin Total 0.4 0.0 - 1.2 mg/dL   Bilirubin, Direct 4.780.12 0.00 - 0.40 mg/dL   Alkaline Phosphatase 79 39 - 117 IU/L   AST 19 0 - 40 IU/L   ALT 18 0 - 44 IU/L  PSA  Result Value Ref Range   Prostate Specific Ag, Serum 0.6 0.0 - 4.0 ng/mL  Hemoglobin A1c  Result Value Ref Range   Hgb A1c MFr Bld 6.4 (H) 4.8 - 5.6 %   Est. average glucose Bld gHb Est-mCnc 137 mg/dL   Exercising no t much, working at work   Uses prev agent for asthma well,,  A review of their health history was completed.  A review of medications was also completed.  Any needed refills; None  Eating habits: Patient states eating is poor. Eats a lot of food.  Falls/  MVA accidents in past few months: None  Regular exercise: Patient states no exercise. Specialist pt sees on regular basis: None  Preventative health issues were discussed.   Additional concerns: Would like to have skin tag on neck removed. Patient claims compliance with cholesterol medication. No obvious side effects. Watching his diet. Also compliant with his diabetes medicine. Generally does not miss a dose. More fasting sugars looking better better.  Next  Reports asthma is stable as long as he sticks with his Qvar. Next  Not exercising as much as he would hope. psa better  Eye doc last yr, no evid of diabetes concerns   Review of Systems  Constitutional: Negative for fever, activity change and appetite change.  HENT: Negative for congestion and rhinorrhea.   Eyes: Negative for discharge.  Respiratory: Negative for cough and wheezing.   Cardiovascular: Negative for chest pain.  Gastrointestinal: Negative for vomiting, abdominal pain and blood in stool.  Genitourinary: Negative for frequency and difficulty urinating.  Musculoskeletal: Negative for neck pain.  Skin: Negative for rash.  Allergic/Immunologic: Negative for environmental allergies and food allergies.  Neurological: Negative for weakness and headaches.  Psychiatric/Behavioral: Negative for agitation.  All other systems reviewed and are negative.      Objective:   Physical Exam  Constitutional: He appears well-developed and well-nourished.  HENT:  Head: Normocephalic and atraumatic.  Right Ear: External ear normal.  Left Ear: External ear normal.  Nose: Nose normal.  Mouth/Throat: Oropharynx is clear and moist.  Eyes: EOM are normal. Pupils are equal, round, and reactive to light.  Neck: Normal range of motion. Neck supple. No thyromegaly present.  Cardiovascular: Normal rate, regular rhythm  and normal heart sounds.   No murmur heard. Pulmonary/Chest: Effort normal and breath sounds normal. No respiratory distress. He has no wheezes.  Abdominal: Soft. Bowel sounds are normal. He exhibits no distension and no mass. There is no tenderness.  Genitourinary: Penis normal.  Musculoskeletal: Normal range of motion. He exhibits no edema.  Lymphadenopathy:    He has no cervical adenopathy.  Neurological: He is alert. He exhibits normal muscle tone.  Skin: Skin is warm and dry. No erythema.  Psychiatric: He has a normal mood and affect. His behavior is normal.  Judgment normal.  Vitals reviewed.  Ankles without edema feet pulses good sensation intact       Assessment & Plan:  Impression 1 wellness exam #2 hyperlipidemia decent control not perfect discussed maintain same medicine #3 type 2 diabetes good control to maintain same #4 chronic asthma stable on preventive meds handling things well plan colonoscopy discuss and proper intervals encourage. Medications refilled. Diet exercise discussed. Recheck in 6 months. WSL

## 2016-01-04 NOTE — Patient Instructions (Signed)
Results for orders placed or performed in visit on 12/28/15  Lipid panel  Result Value Ref Range   Cholesterol, Total 199 100 - 199 mg/dL   Triglycerides 960159 (H) 0 - 149 mg/dL   HDL 45 >45>39 mg/dL   VLDL Cholesterol Cal 32 5 - 40 mg/dL   LDL Calculated 409122 (H) 0 - 99 mg/dL   Chol/HDL Ratio 4.4 0.0 - 5.0 ratio units  Microalbumin / creatinine urine ratio  Result Value Ref Range   Creatinine, Urine 145.2 Not Estab. mg/dL   Microalbum.,U,Random 11.0 Not Estab. ug/mL   MICROALB/CREAT RATIO 7.6 0.0 - 30.0 mg/g creat  Hepatic function panel  Result Value Ref Range   Total Protein 7.5 6.0 - 8.5 g/dL   Albumin 4.5 3.5 - 5.5 g/dL   Bilirubin Total 0.4 0.0 - 1.2 mg/dL   Bilirubin, Direct 8.110.12 0.00 - 0.40 mg/dL   Alkaline Phosphatase 79 39 - 117 IU/L   AST 19 0 - 40 IU/L   ALT 18 0 - 44 IU/L  PSA  Result Value Ref Range   Prostate Specific Ag, Serum 0.6 0.0 - 4.0 ng/mL  Hemoglobin A1c  Result Value Ref Range   Hgb A1c MFr Bld 6.4 (H) 4.8 - 5.6 %   Est. average glucose Bld gHb Est-mCnc 137 mg/dL

## 2016-04-13 ENCOUNTER — Ambulatory Visit (INDEPENDENT_AMBULATORY_CARE_PROVIDER_SITE_OTHER): Payer: BLUE CROSS/BLUE SHIELD | Admitting: Family Medicine

## 2016-04-13 ENCOUNTER — Encounter: Payer: Self-pay | Admitting: Family Medicine

## 2016-04-13 ENCOUNTER — Other Ambulatory Visit: Payer: Self-pay | Admitting: *Deleted

## 2016-04-13 VITALS — BP 128/84 | Temp 98.4°F | Ht 67.0 in | Wt 174.0 lb

## 2016-04-13 DIAGNOSIS — R350 Frequency of micturition: Secondary | ICD-10-CM | POA: Diagnosis not present

## 2016-04-13 DIAGNOSIS — N400 Enlarged prostate without lower urinary tract symptoms: Secondary | ICD-10-CM

## 2016-04-13 LAB — POCT URINALYSIS DIPSTICK
PH UA: 5
Spec Grav, UA: >=1.03

## 2016-04-13 MED ORDER — TADALAFIL 20 MG PO TABS: 10.0000 mg | ORAL_TABLET | ORAL | 11 refills | Status: DC | PRN

## 2016-04-13 NOTE — Addendum Note (Signed)
Addended by: Merlyn AlbertLUKING, WILLIAM S on: 04/13/2016 11:51 AM   Modules accepted: Orders

## 2016-04-13 NOTE — Progress Notes (Addendum)
   Subjective:    Patient ID: Rodney Meyers, male    DOB: Jul 30, 1956, 60 y.o.   MRN: [id number]  Urinary Frequency   This is a new problem. Episode onset: few months. Associated symptoms include frequency. He has tried nothing for the symptoms.    patient notes increased urnaton gets up 3-4 times every night. This is been going on for the pat ar.  Not particlarly orse th past fw weesjust is reached a point where he needs to get smething done. Next   had a prostate exam within normal limitpus normal PSA 4 months ago   Review of Systems  Genitourinary: Positive for frequency.       Objective:   Physical Exam   alert vitals stable lungs clear heart regular in rhythm patient decline rsat exam      Assessment & Plan:   imprsion prostate hypertrophy discsseat lenh plan Flomax 0.4 mg daily at bedtime. Also prescription o Cal wttenby patetqus

## 2016-06-13 ENCOUNTER — Encounter: Payer: Self-pay | Admitting: Family Medicine

## 2016-06-13 ENCOUNTER — Ambulatory Visit (INDEPENDENT_AMBULATORY_CARE_PROVIDER_SITE_OTHER): Payer: BLUE CROSS/BLUE SHIELD | Admitting: Family Medicine

## 2016-06-13 VITALS — BP 122/80 | Temp 98.0°F | Ht 67.0 in | Wt 175.4 lb

## 2016-06-13 DIAGNOSIS — J019 Acute sinusitis, unspecified: Secondary | ICD-10-CM | POA: Diagnosis not present

## 2016-06-13 DIAGNOSIS — J4530 Mild persistent asthma, uncomplicated: Secondary | ICD-10-CM | POA: Diagnosis not present

## 2016-06-13 MED ORDER — AMOXICILLIN-POT CLAVULANATE 875-125 MG PO TABS: ORAL_TABLET | ORAL | 0 refills | Status: DC

## 2016-06-13 NOTE — Progress Notes (Signed)
   Subjective:    Patient ID: Rodney Meyers, male    DOB: 1955-12-04, 60 y.o.   MRN: [id number]  Cough  This is a new problem. The current episode started in the past 7 days. The problem has been unchanged. The cough is non-productive. Associated symptoms include a sore throat and wheezing. Associated symptoms comments: runny nose. Nothing aggravates the symptoms. Treatments tried: tylenol, nyquil. The treatment provided no relief.   Patient has no other concerns at this time.   Son had   achiness in the muscles and joints  Sore throat and heafd  Wheezing and asthma flared up cough productive  Pos prod  Review of Systems  HENT: Positive for sore throat.   Respiratory: Positive for cough and wheezing.        Objective:   Physical Exam  Alert, mild malaise. Hydration good Vitals stable. frontal/ maxillary tenderness evident positive nasal congestion. pharynx normal neck supple  lungs clear/no crackles Very mild wheezes. heart regular in rhythm       Assessment & Plan:  Impression rhinosinusitis /bronchitis with element of reactive airways likely post viral, discussed with patient. plan antibiotics prescribed. Questions answered. Symptomatic care discussed. warning signs discussed. WSL

## 2016-08-30 ENCOUNTER — Other Ambulatory Visit: Payer: Self-pay | Admitting: Family Medicine

## 2016-09-17 ENCOUNTER — Other Ambulatory Visit: Payer: Self-pay | Admitting: Family Medicine

## 2016-09-19 NOTE — Telephone Encounter (Signed)
30 d worth, needs appt 

## 2016-09-19 NOTE — Telephone Encounter (Signed)
May we refill. Patient last seen in May 2017. Please advise?

## 2016-09-29 ENCOUNTER — Other Ambulatory Visit: Payer: Self-pay | Admitting: Family Medicine

## 2016-10-05 ENCOUNTER — Telehealth: Payer: Self-pay | Admitting: *Deleted

## 2016-10-05 MED ORDER — BECLOMETHASONE DIPROPIONATE 40 MCG/ACT IN AERS: 2.0000 | INHALATION_SPRAY | Freq: Two times a day (BID) | RESPIRATORY_TRACT | 2 refills | Status: DC

## 2016-10-05 NOTE — Telephone Encounter (Signed)
ok 

## 2016-10-05 NOTE — Telephone Encounter (Signed)
[facility] Effective February 2018 qvar hfa inhalers  ( 40mcg and 80 mcg) are in the process of being discontinued by the manufacturer and will be replaced by qvar HFA Redihaler inhalers. The replacement product will require a new prescription.  Ok to switch to [employer] redihaler.

## 2016-10-05 NOTE — Telephone Encounter (Signed)
Prescription sent electronically to pharmacy. 

## 2016-10-10 ENCOUNTER — Telehealth: Payer: Self-pay | Admitting: Family Medicine

## 2016-10-10 ENCOUNTER — Ambulatory Visit: Payer: BLUE CROSS/BLUE SHIELD | Admitting: Family Medicine

## 2016-10-10 ENCOUNTER — Encounter (INDEPENDENT_AMBULATORY_CARE_PROVIDER_SITE_OTHER): Payer: BLUE CROSS/BLUE SHIELD | Admitting: Family Medicine

## 2016-10-10 NOTE — Telephone Encounter (Signed)
Patient having problems with his medications but absolutely can not get off work because he says he will get fired. The earliest he can get here would be 4:00. Would it be ok to schedule him one day in a same day appt?

## 2016-10-10 NOTE — Telephone Encounter (Signed)
yes

## 2016-10-10 NOTE — Progress Notes (Signed)
   Subjective:    Patient ID: Rodney Meyers, male    DOB: 05-03-1956, 61 y.o.   MRN: [id number]  Diarrhea   This is a new problem. Episode onset: 2 weeks. Episode frequency: diarrhea after taking metformin.      Review of Systems  Gastrointestinal: Positive for diarrhea.       Objective:   Physical Exam        Assessment & Plan:

## 2016-10-11 NOTE — Telephone Encounter (Signed)
LMOM to call and schedule.

## 2016-10-12 ENCOUNTER — Encounter: Payer: Self-pay | Admitting: Family Medicine

## 2016-10-12 ENCOUNTER — Ambulatory Visit (INDEPENDENT_AMBULATORY_CARE_PROVIDER_SITE_OTHER): Payer: BLUE CROSS/BLUE SHIELD | Admitting: Family Medicine

## 2016-10-12 VITALS — BP 136/82 | Ht 67.0 in | Wt 177.8 lb

## 2016-10-12 DIAGNOSIS — E119 Type 2 diabetes mellitus without complications: Secondary | ICD-10-CM

## 2016-10-12 DIAGNOSIS — J4530 Mild persistent asthma, uncomplicated: Secondary | ICD-10-CM

## 2016-10-12 DIAGNOSIS — E785 Hyperlipidemia, unspecified: Secondary | ICD-10-CM | POA: Diagnosis not present

## 2016-10-12 DIAGNOSIS — Z79899 Other long term (current) drug therapy: Secondary | ICD-10-CM

## 2016-10-12 LAB — POCT GLYCOSYLATED HEMOGLOBIN (HGB A1C): Hemoglobin A1C: 5.6

## 2016-10-12 MED ORDER — PRAVASTATIN SODIUM 40 MG PO TABS: 40.0000 mg | ORAL_TABLET | Freq: Every day | ORAL | 5 refills | Status: DC

## 2016-10-12 MED ORDER — GLIPIZIDE 5 MG PO TABS: 5.0000 mg | ORAL_TABLET | Freq: Every day | ORAL | 5 refills | Status: DC

## 2016-10-12 NOTE — Progress Notes (Signed)
   Subjective:    Patient ID: Rodney Meyers, male    DOB: [birth year], 61 y.o.   MRN: [id number]  Diabetes  He presents for his follow-up diabetic visit. He has type 2 diabetes mellitus. Risk factors for coronary artery disease include dyslipidemia and hypertension. Current diabetic treatment includes oral agent (monotherapy). He is compliant with treatment all of the time. His weight is stable. He is following a diabetic diet.   Metformin has started upsetting stomach and causing diarrhea  Results for orders placed or performed in visit on 10/12/16  POCT glycosylated hemoglobin (Hb A1C)  Result Value Ref Range   Hemoglobin A1C 5.6    Patient claims compliance with diabetes medication. No obvious side effects. Reports no substantial low sugar spells. Most numbers are generally in good range when checked fasting. Generally does not miss a dose of medication. Watching diabetic diet closely  Patient continues to take lipid medication regularly. No obvious side effects from it. Generally does not miss a dose. Prior blood work results are reviewed with patient. Patient continues to work on fat intake in diet  Asthma stable , uses qvar stable, breathing much etter   Patient notes substantial challenge with loose stools from metformin. Feels he needs to take another medication for his diabetes.   Review of Systems No headache, no major weight loss or weight gain, no chest pain no back pain abdominal pain no change in bowel habits complete ROS otherwise negative     Objective:   Physical Exam Alert vitals stable, NAD. Blood pressure good on repeat. HEENT normal. Lungs clear. Heart regular rate and rhythm. Ankles without edema       Assessment & Plan:  Impression type 2 diabetes good control. Intolerance of current medication discussed patient would like to maintain a medicine. But needs to stop metformin will switch to Glucotrol. Warning signs discussed #2 asthma clinically stable and  on meds well deftly helped him wants to stay on the preventable continue #3 hyperlipidemia prior blood work reviewed further blood work ordered. Diet exercise discuss follow-up in 6 months for wellness plus chronic visit

## 2016-10-23 LAB — HEPATIC FUNCTION PANEL
ALK PHOS: 90 IU/L (ref 39–117)
ALT: 20 IU/L (ref 0–44)
AST: 13 IU/L (ref 0–40)
Albumin: 4.6 g/dL (ref 3.5–5.5)
BILIRUBIN, DIRECT: 0.11 mg/dL (ref 0.00–0.40)
Bilirubin Total: 0.4 mg/dL (ref 0.0–1.2)
Total Protein: 7.5 g/dL (ref 6.0–8.5)

## 2016-10-23 LAB — LIPID PANEL
CHOL/HDL RATIO: 5.2 ratio — AB (ref 0.0–5.0)
CHOLESTEROL TOTAL: 218 mg/dL — AB (ref 100–199)
HDL: 42 mg/dL (ref >39)
LDL Calculated: 140 mg/dL — ABNORMAL HIGH (ref 0–99)
TRIGLYCERIDES: 178 mg/dL — AB (ref 0–149)
VLDL Cholesterol Cal: 36 mg/dL (ref 5–40)

## 2016-10-27 MED ORDER — BECLOMETHASONE DIPROPIONATE 40 MCG/ACT IN AERS: 2.0000 | INHALATION_SPRAY | Freq: Two times a day (BID) | RESPIRATORY_TRACT | 2 refills | Status: DC

## 2016-10-27 MED ORDER — PRAVASTATIN SODIUM 80 MG PO TABS: 80.0000 mg | ORAL_TABLET | Freq: Every day | ORAL | 1 refills | Status: DC

## 2016-10-27 NOTE — Addendum Note (Signed)
Addended by: Jeralene PetersREWS, Durant Scibilia R on: 10/27/2016 04:37 PM   Modules accepted: Orders

## 2016-11-12 ENCOUNTER — Other Ambulatory Visit: Payer: Self-pay | Admitting: Family Medicine

## 2017-02-22 NOTE — Progress Notes (Signed)
This encounter was created in error - please disregard.

## 2017-04-13 ENCOUNTER — Telehealth: Payer: Self-pay | Admitting: Family Medicine

## 2017-04-13 MED ORDER — FLUTICASONE PROPIONATE HFA 44 MCG/ACT IN AERO: 2.0000 | INHALATION_SPRAY | Freq: Two times a day (BID) | RESPIRATORY_TRACT | 2 refills | Status: DC

## 2017-04-13 NOTE — Telephone Encounter (Signed)
Received fax from pharmacy stating the Qvar inhaler is no longer available. May we send in flovent?

## 2017-04-13 NOTE — Addendum Note (Signed)
Addended by: Margaretha SheffieldBROWN, AUTUMN S on: 04/13/2017 04:43 PM   Modules accepted: Orders

## 2017-04-13 NOTE — Telephone Encounter (Signed)
flovent 44 two puffs bid plus two ref

## 2017-04-13 NOTE — Telephone Encounter (Signed)
Prescription sent electronically to pharmacy. Patient notified. 

## 2017-04-14 ENCOUNTER — Other Ambulatory Visit: Payer: Self-pay | Admitting: Family Medicine

## 2017-05-09 ENCOUNTER — Ambulatory Visit (HOSPITAL_COMMUNITY)
Admission: RE | Admit: 2017-05-09 | Discharge: 2017-05-09 | Disposition: A | Discharge status: Home / Self Care | Payer: BLUE CROSS/BLUE SHIELD | Source: Ambulatory Visit | Attending: Family Medicine | Admitting: Family Medicine

## 2017-05-09 ENCOUNTER — Encounter: Payer: Self-pay | Admitting: Family Medicine

## 2017-05-09 ENCOUNTER — Ambulatory Visit (INDEPENDENT_AMBULATORY_CARE_PROVIDER_SITE_OTHER): Payer: BLUE CROSS/BLUE SHIELD | Admitting: Family Medicine

## 2017-05-09 ENCOUNTER — Other Ambulatory Visit (HOSPITAL_COMMUNITY)
Admission: RE | Admit: 2017-05-09 | Discharge: 2017-05-09 | Disposition: A | Discharge status: Home / Self Care | Payer: BLUE CROSS/BLUE SHIELD | Source: Ambulatory Visit | Attending: Family Medicine | Admitting: Family Medicine

## 2017-05-09 VITALS — BP 124/76 | Temp 98.1°F | Ht 67.0 in | Wt 181.0 lb

## 2017-05-09 DIAGNOSIS — R109 Unspecified abdominal pain: Secondary | ICD-10-CM

## 2017-05-09 DIAGNOSIS — R81 Glycosuria: Secondary | ICD-10-CM | POA: Diagnosis not present

## 2017-05-09 DIAGNOSIS — N202 Calculus of kidney with calculus of ureter: Secondary | ICD-10-CM | POA: Insufficient documentation

## 2017-05-09 DIAGNOSIS — N200 Calculus of kidney: Secondary | ICD-10-CM

## 2017-05-09 DIAGNOSIS — K760 Fatty (change of) liver, not elsewhere classified: Secondary | ICD-10-CM | POA: Insufficient documentation

## 2017-05-09 DIAGNOSIS — R1031 Right lower quadrant pain: Secondary | ICD-10-CM | POA: Diagnosis not present

## 2017-05-09 LAB — POCT GLUCOSE (DEVICE FOR HOME USE): POC GLUCOSE: 145 mg/dL — AB (ref 70–99)

## 2017-05-09 LAB — CBC WITH DIFFERENTIAL/PLATELET
Basophils Absolute: 0 K/uL (ref 0.0–0.1)
Basophils Relative: 0 %
Eosinophils Absolute: 0 K/uL (ref 0.0–0.7)
Eosinophils Relative: 0 %
HCT: 45.4 % (ref 39.0–52.0)
Hemoglobin: 14.7 g/dL (ref 13.0–17.0)
Lymphocytes Relative: 13 %
Lymphs Abs: 1.2 K/uL (ref 0.7–4.0)
MCH: 25.7 pg — ABNORMAL LOW (ref 26.0–34.0)
MCHC: 32.4 g/dL (ref 30.0–36.0)
MCV: 79.2 fL (ref 78.0–100.0)
Monocytes Absolute: 0.3 K/uL (ref 0.1–1.0)
Monocytes Relative: 3 %
Neutro Abs: 7.6 K/uL (ref 1.7–7.7)
Neutrophils Relative %: 84 %
Platelets: 201 K/uL (ref 150–400)
RBC: 5.73 MIL/uL (ref 4.22–5.81)
RDW: 14 % (ref 11.5–15.5)
WBC: 9.1 K/uL (ref 4.0–10.5)

## 2017-05-09 LAB — POCT URINALYSIS DIPSTICK
Blood, UA: 250
Glucose, UA: 50
PROTEIN UA: 30
Spec Grav, UA: >=1.03 — AB (ref 1.010–1.025)
pH, UA: 5 (ref 5.0–8.0)

## 2017-05-09 LAB — BASIC METABOLIC PANEL WITH GFR
Anion gap: 7 (ref 5–15)
BUN: 22 mg/dL — ABNORMAL HIGH (ref 6–20)
CO2: 26 mmol/L (ref 22–32)
Calcium: 9.6 mg/dL (ref 8.9–10.3)
Chloride: 102 mmol/L (ref 101–111)
Creatinine, Ser: 1.24 mg/dL (ref 0.61–1.24)
GFR calc Af Amer: >60 mL/min
GFR calc non Af Amer: >60 mL/min
Glucose, Bld: 178 mg/dL — ABNORMAL HIGH (ref 65–99)
Potassium: 4.4 mmol/L (ref 3.5–5.1)
Sodium: 135 mmol/L (ref 135–145)

## 2017-05-09 MED ORDER — ONDANSETRON 4 MG PO TBDP: 4.0000 mg | ORAL_TABLET | Freq: Three times a day (TID) | ORAL | 0 refills | Status: DC | PRN

## 2017-05-09 MED ORDER — TAMSULOSIN HCL 0.4 MG PO CAPS: 0.4000 mg | ORAL_CAPSULE | Freq: Every day | ORAL | 3 refills | Status: DC

## 2017-05-09 NOTE — Progress Notes (Signed)
   Subjective:    Patient ID: Rodney Meyers, male    DOB: [birth year], 61 y.o.   MRN: [id number]  Abdominal Pain  This is a new problem. The current episode started today. Associated symptoms include diarrhea and vomiting. Treatments tried: mylanta, tums.   Fairly severe low abd pain, earlier this morning extremely painful, patient was unable to find a resolution the pain. No obvious dysuria. No known history of kidney stones.  Pt wondering if dui to new water  vom times two  diarrhe small amnt several times    Review of Systems  Gastrointestinal: Positive for abdominal pain, diarrhea and vomiting.       Objective:   Physical Exam Alert and oriented, vitals reviewed and stable, NAD ENT-TM's and ext canals WNL bilat via otoscopic exam Soft palate, tonsils and post pharynx WNL via oropharyngeal exam Neck-symmetric, no masses; thyroid nonpalpable and nontender Pulmonary-no tachypnea or accessory muscle use; Clear without wheezes via auscultation Card--no abnrml murmurs, rhythm reg and rate WNL Carotid pulses symmetric, without bruits Distinct right lower quadrant tenderness no rebound no guarding bowel sounds present  Urinalysis numerous red blood cells no white blood cells       Assessment & Plan:  Impression severe colicky pain sudden onset earlier today somewhat improved at this point. With hematuria concerning for kidney stones sent for urgent workup. That I call patient later in the day after CT results returned revealing right ureter stone with element of hydronephrosis. Pain treatment discuss. Nausea treatment discuss. Hydration discussed. Screening urine. At 5 mm patient near may not pa

## 2017-05-10 ENCOUNTER — Other Ambulatory Visit: Payer: Self-pay | Admitting: *Deleted

## 2017-05-10 DIAGNOSIS — N200 Calculus of kidney: Secondary | ICD-10-CM

## 2017-05-17 LAB — STONE ANALYSIS
CA OXALATE, MONOHYDR.: 27 %
Ca phos cry stone ql IR: 3 %
STONE WEIGHT: 92.5 mg
URIC ACID: 70 %

## 2017-05-30 ENCOUNTER — Ambulatory Visit (INDEPENDENT_AMBULATORY_CARE_PROVIDER_SITE_OTHER): Payer: BLUE CROSS/BLUE SHIELD | Admitting: Family Medicine

## 2017-05-30 ENCOUNTER — Encounter: Payer: Self-pay | Admitting: Family Medicine

## 2017-05-30 VITALS — BP 136/90 | Ht 67.0 in | Wt 180.0 lb

## 2017-05-30 DIAGNOSIS — Z79899 Other long term (current) drug therapy: Secondary | ICD-10-CM

## 2017-05-30 DIAGNOSIS — Z125 Encounter for screening for malignant neoplasm of prostate: Secondary | ICD-10-CM

## 2017-05-30 DIAGNOSIS — E119 Type 2 diabetes mellitus without complications: Secondary | ICD-10-CM | POA: Diagnosis not present

## 2017-05-30 DIAGNOSIS — Z23 Encounter for immunization: Secondary | ICD-10-CM

## 2017-05-30 DIAGNOSIS — E785 Hyperlipidemia, unspecified: Secondary | ICD-10-CM | POA: Diagnosis not present

## 2017-05-30 DIAGNOSIS — J4530 Mild persistent asthma, uncomplicated: Secondary | ICD-10-CM

## 2017-05-30 LAB — POCT GLYCOSYLATED HEMOGLOBIN (HGB A1C): HEMOGLOBIN A1C: 7.1

## 2017-05-30 MED ORDER — PRAVASTATIN SODIUM 80 MG PO TABS
80.0000 mg | ORAL_TABLET | Freq: Every day | ORAL | 5 refills | Status: DC
Start: 2017-05-30 — End: 2018-07-05

## 2017-05-30 MED ORDER — TADALAFIL 20 MG PO TABS: 10.0000 mg | ORAL_TABLET | ORAL | 5 refills | Status: DC | PRN

## 2017-05-30 MED ORDER — GLIPIZIDE 5 MG PO TABS: ORAL_TABLET | ORAL | 5 refills | Status: DC

## 2017-05-30 NOTE — Progress Notes (Signed)
   Subjective:    Patient ID: Rodney Meyers, male    DOB: 01-Feb-1956, 61 y.o.   MRN: [id number]  HPIDiabetes. Pt does not check blood sugar. Eats healthy. Does not exercise. Eye exam is not up to date. Last one 05/2015. Results for orders placed or performed in visit on 05/30/17  POCT glycosylated hemoglobin (Hb A1C)  Result Value Ref Range   Hemoglobin A1C 7.1    Patient claims compliance with diabetes medication. No obvious side effects. Reports no substantial low sugar spells. Most numbers are generally in good range when checked fasting. Generally does not miss a dose of medication. Watching diabetic diet closely   Flu vaccine today.  Would like a handicapp parking permit due to his allergies. States he has to walk a long way to into work and the pollen bothers him.   Has passed two kidney stones. Not having any issues now. Wonders what s scan showed. Wonders if he has anymore stones on the way. No history of gout   Patient compliant with his asthma medication. No obvious side effects. Definitely helpinghis breathing.  Patient continues to take lipid medication regularly. No obvious side effects from it. Generally does not miss a dose. Prior blood work results are reviewed with patient. Patient continues to work on fat intake in diet  Review of Systems No headache, no major weight loss or weight gain, no chest pain no back pain abdominal pain no change in bowel habits complete ROS otherwise negative     Objective:   Physical Exam Alert and oriented, vitals reviewed and stable, NAD ENT-TM's and ext canals WNL bilat via otoscopic exam Soft palate, tonsils and post pharynx WNL via oropharyngeal exam Neck-symmetric, no masses; thyroid nonpalpable and nontender Pulmonary-no tachypnea or accessory muscle use; Clear without wheezes via auscultation Card--no abnrml murmurs, rhythm reg and rate WNL Carotid pulses symmetric, without bruits        Assessment & Plan:  Impression  type 2 diabetes control good but not perfect to maintain current medicions  #2 asthma clinically stable and improved on chronic sterhaler.  Uric acid kidney stones. Discussed at length. Given educational information regarding diet. Scan results discussed. Patient still has kidney stones remaining  #4 hyperlipidemia. Results discussed. Prior results. Compliance discussed maintain same  Maintain same meds. Appropriate blood work. Flu shot.follow-up in 6 months

## 2017-05-30 NOTE — Patient Instructions (Addendum)
Come on, Bell, start exercising and working on your diet!  Low-Purine Diet Purines are compounds that affect the level of uric acid in your body. A low-purine diet is a diet that is low in purines. Eating a low-purine diet can prevent the level of uric acid in your body from getting too high and causing gout or kidney stones or both. What do I need to know about this diet?  Choose low-purine foods. Examples of low-purine foods are listed in the next section.  Drink plenty of fluids, especially water. Fluids can help remove uric acid from your body. Try to drink 8-16 cups (1.9-3.8 L) a day.  Limit foods high in fat, especially saturated fat, as fat makes it harder for the body to get rid of uric acid. Foods high in saturated fat include pizza, cheese, ice cream, whole milk, fried foods, and gravies. Choose foods that are lower in fat and lean sources of protein. Use olive oil when cooking as it contains healthy fats that are not high in saturated fat.  Limit alcohol. Alcohol interferes with the elimination of uric acid from your body. If you are having a gout attack, avoid all alcohol.  Keep in mind that different people's bodies react differently to different foods. You will probably learn over time which foods do or do not affect you. If you discover that a food tends to cause your gout to flare up, avoid eating that food. You can more freely enjoy foods that do not cause problems. If you have any questions about a food item, talk to your dietitian or health care provider. Which foods are low, moderate, and high in purines? The following is a list of foods that are low, moderate, and high in purines. You can eat any amount of the foods that are low in purines. You may be able to have small amounts of foods that are moderate in purines. Ask your health care provider how much of a food moderate in purines you can have. Avoid foods high in purines. Grains  Foods low in purines: Enriched white bread,  pasta, rice, cake, cornbread, popcorn.  Foods moderate in purines: Whole-grain breads and cereals, wheat germ, bran, oatmeal. Uncooked oatmeal. Dry wheat bran or wheat germ.  Foods high in purines: Pancakes, [place] toast, biscuits, muffins. Vegetables  Foods low in purines: All vegetables, except those that are moderate in purines.  Foods moderate in purines: Asparagus, cauliflower, spinach, mushrooms, green peas. Fruits  All fruits are low in purines. Meats and other Protein Foods  Foods low in purines: Eggs, nuts, peanut butter.  Foods moderate in purines: 80-90% lean beef, lamb, veal, pork, poultry, fish, eggs, peanut butter, nuts. Crab, lobster, oysters, and shrimp. Cooked dried beans, peas, and lentils.  Foods high in purines: Anchovies, sardines, herring, mussels, tuna, codfish, scallops, trout, and haddock. Rodney Meyers. Organ meats (such as liver or kidney). Tripe. Game meat. Goose. Sweetbreads. Dairy  All dairy foods are low in purines. Low-fat and fat-free dairy products are best because they are low in saturated fat. Beverages  Drinks low in purines: Water, carbonated beverages, tea, coffee, cocoa.  Drinks moderate in purines: Soft drinks and other drinks sweetened with high-fructose corn syrup. Juices. To find whether a food or drink is sweetened with high-fructose corn syrup, look at the ingredients list.  Drinks high in purines: Alcoholic beverages (such as beer). Condiments  Foods low in purines: Salt, herbs, olives, pickles, relishes, vinegar.  Foods moderate in purines: Butter, margarine, oils, mayonnaise. Fats  and Oils  Foods low in purines: All types, except gravies and sauces made with meat.  Foods high in purines: Gravies and sauces made with meat. Other Foods  Foods low in purines: Sugars, sweets, gelatin. Cake. Soups made without meat.  Foods moderate in purines: Meat-based or fish-based soups, broths, or bouillons. Foods and drinks sweetened with  high-fructose corn syrup.  Foods high in purines: High-fat desserts (such as ice cream, cookies, cakes, pies, doughnuts, and chocolate). Contact your dietitian for more information on foods that are not listed here. This information is not intended to replace advice given to you by your health care provider. Make sure you discuss any questions you have with your health care provider. Document Released: 11/26/2010 Document Revised: 01/07/2016 Document Reviewed: 07/08/2013 Elsevier Interactive Patient Education  2017 [employer].

## 2017-05-31 LAB — LIPID PANEL
Chol/HDL Ratio: 4.9 ratio (ref 0.0–5.0)
Cholesterol, Total: 196 mg/dL (ref 100–199)
HDL: 40 mg/dL (ref >39)
LDL Calculated: 112 mg/dL — ABNORMAL HIGH (ref 0–99)
Triglycerides: 218 mg/dL — ABNORMAL HIGH (ref 0–149)
VLDL CHOLESTEROL CAL: 44 mg/dL — AB (ref 5–40)

## 2017-05-31 LAB — PSA: Prostate Specific Ag, Serum: 0.8 ng/mL (ref 0.0–4.0)

## 2017-05-31 LAB — MICROALBUMIN / CREATININE URINE RATIO
CREATININE, UR: 128 mg/dL
MICROALBUM., U, RANDOM: 3.7 ug/mL
Microalb/Creat Ratio: 2.9 mg/g creat (ref 0.0–30.0)

## 2017-05-31 LAB — HEPATIC FUNCTION PANEL
ALT: 20 IU/L (ref 0–44)
AST: 18 IU/L (ref 0–40)
Albumin: 4.3 g/dL (ref 3.5–5.5)
Alkaline Phosphatase: 87 IU/L (ref 39–117)
Bilirubin Total: 0.3 mg/dL (ref 0.0–1.2)
Bilirubin, Direct: 0.11 mg/dL (ref 0.00–0.40)
TOTAL PROTEIN: 7.6 g/dL (ref 6.0–8.5)

## 2017-06-04 ENCOUNTER — Encounter: Payer: Self-pay | Admitting: Family Medicine

## 2017-06-20 ENCOUNTER — Other Ambulatory Visit: Payer: Self-pay | Admitting: Family Medicine

## 2017-07-04 ENCOUNTER — Telehealth: Payer: Self-pay

## 2017-07-04 NOTE — Telephone Encounter (Signed)
Per Rodney Meyers with [facility] pt was approved for the Cialis.

## 2017-07-14 ENCOUNTER — Ambulatory Visit: Payer: BLUE CROSS/BLUE SHIELD | Admitting: Family Medicine

## 2017-07-14 ENCOUNTER — Encounter: Payer: Self-pay | Admitting: Family Medicine

## 2017-07-14 VITALS — BP 122/70 | Temp 98.4°F | Ht 67.0 in | Wt 176.0 lb

## 2017-07-14 DIAGNOSIS — J019 Acute sinusitis, unspecified: Secondary | ICD-10-CM

## 2017-07-14 MED ORDER — AMOXICILLIN-POT CLAVULANATE 875-125 MG PO TABS: 1.0000 | ORAL_TABLET | Freq: Two times a day (BID) | ORAL | 0 refills | Status: AC

## 2017-07-14 NOTE — Progress Notes (Signed)
   Subjective:    Patient ID: Raynelle JanSaif U Alkema, male    DOB: [birth year], 61 y.o.   MRN: [id number]  HPI Patient is here today for body aches,cough,wheezing,runny nose,headache,sore throat,fever, all of these symptoms started a week ago. He has takenTylenol and Nyquil which have helped some.  cpugh nd headache and feeling bad   Pos prod cough   No fever low grade   Energy level poor     Pos aching and knees aching , dim energy    Review of Systems No headache, no major weight loss or weight gain, no chest pain no back pain abdominal pain no change in bowel habits complete ROS otherwise negative     Objective:   Physical Exam   Alert, mild malaise. Hydration good Vitals stable. frontal/ maxillary tenderness evident positive nasal congestion. pharynx normal neck supple  lungs clear/no crackles or wheezes. heart regular in rhythm      Assessment & Plan:  Impression rhinosinusitis likely post viral, discussed with patient. plan antibiotics prescribed. Questions answered. Symptomatic care discussed. warning signs discussed. WSL

## 2017-07-28 ENCOUNTER — Other Ambulatory Visit: Payer: Self-pay | Admitting: Family Medicine

## 2017-08-02 ENCOUNTER — Other Ambulatory Visit: Payer: Self-pay | Admitting: Family Medicine

## 2017-09-11 ENCOUNTER — Encounter: Payer: Self-pay | Admitting: Family Medicine

## 2017-09-11 ENCOUNTER — Ambulatory Visit (INDEPENDENT_AMBULATORY_CARE_PROVIDER_SITE_OTHER): Payer: BLUE CROSS/BLUE SHIELD | Admitting: Family Medicine

## 2017-09-11 VITALS — BP 112/60 | Temp 99.1°F | Ht 67.0 in | Wt 169.1 lb

## 2017-09-11 DIAGNOSIS — J019 Acute sinusitis, unspecified: Secondary | ICD-10-CM | POA: Diagnosis not present

## 2017-09-11 MED ORDER — AMOXICILLIN-POT CLAVULANATE 875-125 MG PO TABS: 1.0000 | ORAL_TABLET | Freq: Two times a day (BID) | ORAL | 0 refills | Status: AC

## 2017-09-11 NOTE — Progress Notes (Signed)
   Subjective:    Patient ID: Rodney Meyers, male    DOB: 08-31-1955, 62 y.o.   MRN: [id number]  HPI  Patient is here today with complaints of cough,congestion,sinus pressure,sore throat,headache,chills,wheezing for two weeks now. He has been taking nyquil,tylenol which is not helping.forntal heafache , worse with otio   Testing headache.  Worse with cough.  Positive productive phlegm pain is sharp at times aching other  Review of Systems No headache, no major weight loss or weight gain, no chest pain no back pain abdominal pain no change in bowel habits complete ROS otherwise negative     Objective:   Physical Exam  Alert, mild malaise. Hydration good Vitals stable. frontal/ maxillary tenderness evident positive nasal congestion. pharynx normal neck supple  lungs clear/no crackles or wheezes. heart regular in rhythm       Assessment & Plan:  2 weeks duration symptom progressive frontal headache worse with coughImpression rhinosinusitis likely post viral, discussed with patient. plan antibiotics prescribed. Questions answered. Symptomatic care discussed. warning signs discussed. WSL

## 2017-11-28 ENCOUNTER — Ambulatory Visit: Payer: BLUE CROSS/BLUE SHIELD | Admitting: Family Medicine

## 2018-01-03 ENCOUNTER — Other Ambulatory Visit: Payer: Self-pay | Admitting: Family Medicine

## 2018-05-22 ENCOUNTER — Other Ambulatory Visit: Payer: Self-pay | Admitting: Family Medicine

## 2018-07-05 ENCOUNTER — Encounter: Payer: Self-pay | Admitting: Family Medicine

## 2018-07-05 ENCOUNTER — Ambulatory Visit: Payer: BLUE CROSS/BLUE SHIELD | Admitting: Family Medicine

## 2018-07-05 VITALS — BP 134/88 | Temp 98.4°F | Ht 67.0 in | Wt 177.2 lb

## 2018-07-05 DIAGNOSIS — Z79899 Other long term (current) drug therapy: Secondary | ICD-10-CM

## 2018-07-05 DIAGNOSIS — E785 Hyperlipidemia, unspecified: Secondary | ICD-10-CM | POA: Diagnosis not present

## 2018-07-05 DIAGNOSIS — J4530 Mild persistent asthma, uncomplicated: Secondary | ICD-10-CM | POA: Diagnosis not present

## 2018-07-05 DIAGNOSIS — R350 Frequency of micturition: Secondary | ICD-10-CM | POA: Diagnosis not present

## 2018-07-05 DIAGNOSIS — E119 Type 2 diabetes mellitus without complications: Secondary | ICD-10-CM | POA: Diagnosis not present

## 2018-07-05 DIAGNOSIS — Z125 Encounter for screening for malignant neoplasm of prostate: Secondary | ICD-10-CM

## 2018-07-05 LAB — POCT URINALYSIS DIPSTICK
Spec Grav, UA: 1.02 (ref 1.010–1.025)
pH, UA: 6 (ref 5.0–8.0)

## 2018-07-05 MED ORDER — GLIPIZIDE 5 MG PO TABS: ORAL_TABLET | ORAL | 5 refills | Status: DC

## 2018-07-05 MED ORDER — TAMSULOSIN HCL 0.4 MG PO CAPS: 0.4000 mg | ORAL_CAPSULE | Freq: Every day | ORAL | 1 refills | Status: DC

## 2018-07-05 MED ORDER — PRAVASTATIN SODIUM 80 MG PO TABS: 80.0000 mg | ORAL_TABLET | Freq: Every day | ORAL | 5 refills | Status: DC

## 2018-07-05 MED ORDER — FLUTICASONE PROPIONATE HFA 44 MCG/ACT IN AERO: INHALATION_SPRAY | RESPIRATORY_TRACT | 5 refills | Status: DC

## 2018-07-05 NOTE — Progress Notes (Signed)
   Subjective:    Patient ID: Rodney Meyers, male    DOB: [birth year], 62 y.o.   MRN: [id number]  HPIFrequent urination.   Results for orders placed or performed in visit on 07/05/18  POCT urinalysis dipstick  Result Value Ref Range   Color, UA     Clarity, UA     Glucose, UA     Bilirubin, UA     Ketones, UA     Spec Grav, UA 1.020 1.010 - 1.025   Blood, UA     pH, UA 6.0 5.0 - 8.0   Protein, UA     Urobilinogen, UA     Nitrite, UA     Leukocytes, UA     Appearance     Odor      Patient claims compliance with diabetes medication. No obvious side effects. Reports no substantial low sugar spells. Most numbers are generally in good range when checked fasting. Generally does not miss a dose of medication. Watching diabetic diet closely  Patient continues to take lipid medication regularly. No obvious side effects from it. Generally does not miss a dose. Prior blood work results are reviewed with patient. Patient continues to work on fat intake in diet  Asthma, uses flovent.  Patient states that Flovent has led him to be able to completely avoid albuterol.  States it helps him tremendously.  In the past patient experienced prostate hypertrophy symptoms.  He definitely did help with urination plus nocturia   Trying to watch diet   Lots of walking with exercise    Still uses the flovent faithfully      Review of Systems No headache, no major weight loss or weight gain, no chest pain no back pain abdominal pain no change in bowel habits complete ROS otherwise negative     Objective:   Physical Exam  Alert and oriented, vitals reviewed and stable, NAD ENT-TM's and ext canals WNL bilat via otoscopic exam Soft palate, tonsils and post pharynx WNL via oropharyngeal exam Neck-symmetric, no masses; thyroid nonpalpable and nontender Pulmonary-no tachypnea or accessory muscle use; Clear without wheezes via auscultation Card--no abnrml murmurs, rhythm reg and rate  WNL Carotid pulses symmetric, without bruits  Results for orders placed or performed in visit on 07/05/18  POCT urinalysis dipstick  Result Value Ref Range   Color, UA     Clarity, UA     Glucose, UA     Bilirubin, UA     Ketones, UA     Spec Grav, UA 1.020 1.010 - 1.025   Blood, UA     pH, UA 6.0 5.0 - 8.0   Protein, UA     Urobilinogen, UA     Nitrite, UA     Leukocytes, UA     Appearance     Odor          Assessment & Plan:  Impression 1 asthma chronic with ongoing need for Flovent.  Refill proper use discussed  2.  Type 2 diabetes.  A1c uncertain.  Will order blood work.  Continue same meds pending  3.  Prostate hypertrophy with urinary frequency.  Urinalysis normal today.  Will reinitiate Flomax  4.  Hyperlipidemia.  Prior blood work reviewed compliance reviewed  Medications refilled.  Appropriate blood work.  Diet exercise discussed follow-up in 6 months

## 2018-07-18 ENCOUNTER — Encounter: Payer: Self-pay | Admitting: Family Medicine

## 2018-07-18 ENCOUNTER — Ambulatory Visit: Payer: BLUE CROSS/BLUE SHIELD | Admitting: Family Medicine

## 2018-07-18 VITALS — Temp 98.6°F | Ht 67.0 in | Wt 174.0 lb

## 2018-07-18 DIAGNOSIS — J111 Influenza due to unidentified influenza virus with other respiratory manifestations: Secondary | ICD-10-CM

## 2018-07-18 MED ORDER — OSELTAMIVIR PHOSPHATE 75 MG PO CAPS: 75.0000 mg | ORAL_CAPSULE | Freq: Two times a day (BID) | ORAL | 0 refills | Status: DC

## 2018-07-18 NOTE — Progress Notes (Signed)
   Subjective:    Patient ID: Rodney Meyers, male    DOB: [birth year], 62 y.o.   MRN: [id number]  Sinusitis  This is a new problem. The current episode started yesterday. The maximum temperature recorded prior to his arrival was 100.4 - 100.9 F. Associated symptoms include congestion, coughing, headaches and a sore throat. Pertinent negatives include no chills or ear pain. (Diarrhea, body aches)   Body aches low-grade fever not feeling good symptoms or past few days along with some diarrhea   Review of Systems  Constitutional: Positive for fatigue and fever. Negative for activity change and chills.  HENT: Positive for congestion, rhinorrhea and sore throat. Negative for ear pain.   Eyes: Negative for discharge.  Respiratory: Positive for cough. Negative for wheezing.   Cardiovascular: Negative for chest pain.  Gastrointestinal: Negative for nausea and vomiting.  Musculoskeletal: Negative for arthralgias.  Neurological: Positive for headaches.       Objective:   Physical Exam  Constitutional: He appears well-developed.  HENT:  Head: Normocephalic.  Mouth/Throat: Oropharynx is clear and moist. No oropharyngeal exudate.  Neck: Normal range of motion.  Cardiovascular: Normal rate, regular rhythm and normal heart sounds.  No murmur heard. Pulmonary/Chest: Effort normal and breath sounds normal. He has no wheezes.  Lymphadenopathy:    He has no cervical adenopathy.  Neurological: He exhibits normal muscle tone.  Skin: Skin is warm and dry.  Nursing note and vitals reviewed.    Patient not toxic     Assessment & Plan:  Influenza-the patient was diagnosed with influenza. Patient/family educated about the flu and warning signs to watch for. If difficulty breathing,  cyanosis, disorientation, or progressive worsening then immediately get rechecked at the ER. If progressive symptoms be certain to be rechecked. Supportive measures such as Tylenol/ibuprofen was discussed. No aspirin  use in children.

## 2018-07-18 NOTE — Patient Instructions (Signed)

## 2018-07-27 ENCOUNTER — Telehealth: Payer: Self-pay | Admitting: Family Medicine

## 2018-07-27 ENCOUNTER — Other Ambulatory Visit: Payer: Self-pay | Admitting: Family Medicine

## 2018-07-27 MED ORDER — AZITHROMYCIN 250 MG PO TABS: ORAL_TABLET | ORAL | 0 refills | Status: DC

## 2018-07-27 NOTE — Telephone Encounter (Signed)
Medication sent in and patient is aware. Pt also aware that if he gets worse over the weekend it is very important to go to ER and if the patient is not getting back to normal health by Monday he will need to be seen. Pt verbalized understanding.

## 2018-07-27 NOTE — Telephone Encounter (Signed)
I would recommend that the patient finish the Tamiflu  We will add Z-Pak  If the patient gets worse over the weekend it is very important for the patient to go to the ER If the patient is not getting back to normal health by Monday he will need to be seen

## 2018-07-27 NOTE — Telephone Encounter (Signed)
Pt seen on 07/18/2018 by Dr.Scott, pt states he is no better, symptoms haven't gotten better but have not gotten worse either.  requesting another medication to be sent in. Advise.   Pharmacy:  [id number] - [place], [place] - [address] [facility]

## 2018-07-27 NOTE — Telephone Encounter (Signed)
He states he feels no better he says his temp is 98-99. He has chills,body aches,cough.He finished Tamiflu two- three days ago.

## 2018-08-08 LAB — HEPATIC FUNCTION PANEL
ALT: 21 IU/L (ref 0–44)
AST: 18 IU/L (ref 0–40)
Albumin: 4.6 g/dL (ref 3.6–4.8)
Alkaline Phosphatase: 72 IU/L (ref 39–117)
Bilirubin Total: 0.4 mg/dL (ref 0.0–1.2)
Bilirubin, Direct: 0.12 mg/dL (ref 0.00–0.40)
TOTAL PROTEIN: 7.3 g/dL (ref 6.0–8.5)

## 2018-08-08 LAB — MICROALBUMIN / CREATININE URINE RATIO
Creatinine, Urine: 151.2 mg/dL
Microalb/Creat Ratio: 4.8 mg/g creat (ref 0.0–30.0)
Microalbumin, Urine: 7.3 ug/mL

## 2018-08-08 LAB — BASIC METABOLIC PANEL
BUN/Creatinine Ratio: 13 (ref 10–24)
BUN: 16 mg/dL (ref 8–27)
CO2: 23 mmol/L (ref 20–29)
Calcium: 9.9 mg/dL (ref 8.6–10.2)
Chloride: 104 mmol/L (ref 96–106)
Creatinine, Ser: 1.2 mg/dL (ref 0.76–1.27)
GFR calc Af Amer: 76 mL/min/{1.73_m2} (ref >59)
GFR calc non Af Amer: 65 mL/min/{1.73_m2} (ref >59)
Glucose: 110 mg/dL — ABNORMAL HIGH (ref 65–99)
Potassium: 4.6 mmol/L (ref 3.5–5.2)
Sodium: 140 mmol/L (ref 134–144)

## 2018-08-08 LAB — HEMOGLOBIN A1C
Est. average glucose Bld gHb Est-mCnc: 148 mg/dL
Hgb A1c MFr Bld: 6.8 % — ABNORMAL HIGH (ref 4.8–5.6)

## 2018-08-08 LAB — LIPID PANEL
CHOL/HDL RATIO: 4 ratio (ref 0.0–5.0)
Cholesterol, Total: 183 mg/dL (ref 100–199)
HDL: 46 mg/dL (ref >39)
LDL Calculated: 98 mg/dL (ref 0–99)
TRIGLYCERIDES: 197 mg/dL — AB (ref 0–149)
VLDL CHOLESTEROL CAL: 39 mg/dL (ref 5–40)

## 2018-08-08 LAB — PSA: Prostate Specific Ag, Serum: 0.7 ng/mL (ref 0.0–4.0)

## 2018-08-12 ENCOUNTER — Encounter: Payer: Self-pay | Admitting: Family Medicine

## 2018-12-21 ENCOUNTER — Other Ambulatory Visit: Payer: Self-pay | Admitting: Family Medicine

## 2018-12-24 NOTE — Telephone Encounter (Signed)
Call  Pt, six mo visit due next week, sched one, then may refill , virtual

## 2019-01-01 ENCOUNTER — Other Ambulatory Visit: Payer: Self-pay

## 2019-01-01 ENCOUNTER — Ambulatory Visit: Payer: PRIVATE HEALTH INSURANCE | Admitting: Family Medicine

## 2019-01-09 ENCOUNTER — Other Ambulatory Visit: Payer: Self-pay | Admitting: Family Medicine

## 2019-01-09 NOTE — Telephone Encounter (Signed)
Pt no showed last visit, 30 d only, needs appt

## 2019-01-15 ENCOUNTER — Emergency Department (HOSPITAL_COMMUNITY): Payer: BLUE CROSS/BLUE SHIELD

## 2019-01-15 ENCOUNTER — Other Ambulatory Visit: Payer: Self-pay

## 2019-01-15 ENCOUNTER — Emergency Department (HOSPITAL_COMMUNITY)
Admission: EM | Admit: 2019-01-15 | Discharge: 2019-01-15 | Disposition: A | Discharge status: Home / Self Care | Payer: BLUE CROSS/BLUE SHIELD | Attending: Emergency Medicine | Admitting: Emergency Medicine

## 2019-01-15 ENCOUNTER — Encounter (HOSPITAL_COMMUNITY): Payer: Self-pay | Admitting: Emergency Medicine

## 2019-01-15 DIAGNOSIS — J45909 Unspecified asthma, uncomplicated: Secondary | ICD-10-CM | POA: Diagnosis not present

## 2019-01-15 DIAGNOSIS — Z79899 Other long term (current) drug therapy: Secondary | ICD-10-CM | POA: Diagnosis not present

## 2019-01-15 DIAGNOSIS — Z7984 Long term (current) use of oral hypoglycemic drugs: Secondary | ICD-10-CM | POA: Diagnosis not present

## 2019-01-15 DIAGNOSIS — N200 Calculus of kidney: Secondary | ICD-10-CM

## 2019-01-15 DIAGNOSIS — R1031 Right lower quadrant pain: Secondary | ICD-10-CM | POA: Diagnosis present

## 2019-01-15 DIAGNOSIS — E119 Type 2 diabetes mellitus without complications: Secondary | ICD-10-CM | POA: Diagnosis not present

## 2019-01-15 HISTORY — DX: Type 2 diabetes mellitus without complications: E11.9

## 2019-01-15 LAB — COMPREHENSIVE METABOLIC PANEL
ALT: 35 U/L (ref 0–44)
AST: 31 U/L (ref 15–41)
Albumin: 4.3 g/dL (ref 3.5–5.0)
Alkaline Phosphatase: 69 U/L (ref 38–126)
Anion gap: 11 (ref 5–15)
BUN: 23 mg/dL (ref 8–23)
CO2: 22 mmol/L (ref 22–32)
Calcium: 9.1 mg/dL (ref 8.9–10.3)
Chloride: 104 mmol/L (ref 98–111)
Creatinine, Ser: 1.38 mg/dL — ABNORMAL HIGH (ref 0.61–1.24)
GFR calc Af Amer: >60 mL/min (ref >60)
GFR calc non Af Amer: 55 mL/min — ABNORMAL LOW (ref >60)
Glucose, Bld: 225 mg/dL — ABNORMAL HIGH (ref 70–99)
Potassium: 4.4 mmol/L (ref 3.5–5.1)
Sodium: 137 mmol/L (ref 135–145)
Total Bilirubin: 0.7 mg/dL (ref 0.3–1.2)
Total Protein: 7.7 g/dL (ref 6.5–8.1)

## 2019-01-15 LAB — CBC
HCT: 43.8 % (ref 39.0–52.0)
Hemoglobin: 14 g/dL (ref 13.0–17.0)
MCH: 25.9 pg — ABNORMAL LOW (ref 26.0–34.0)
MCHC: 32 g/dL (ref 30.0–36.0)
MCV: 81.1 fL (ref 80.0–100.0)
Platelets: 200 10*3/uL (ref 150–400)
RBC: 5.4 MIL/uL (ref 4.22–5.81)
RDW: 13.7 % (ref 11.5–15.5)
WBC: 10.1 10*3/uL (ref 4.0–10.5)
nRBC: 0 % (ref 0.0–0.2)

## 2019-01-15 LAB — LIPASE, BLOOD: Lipase: 35 U/L (ref 11–51)

## 2019-01-15 MED ORDER — KETOROLAC TROMETHAMINE 30 MG/ML IJ SOLN
30.0000 mg | Freq: Once | INTRAMUSCULAR | Status: AC
  Administered 2019-01-15: 30 mg via INTRAVENOUS
  Filled 2019-01-15: qty 1

## 2019-01-15 MED ORDER — ONDANSETRON HCL 4 MG/2ML IJ SOLN
4.0000 mg | Freq: Once | INTRAMUSCULAR | Status: AC
  Administered 2019-01-15: 02:00:00 4 mg via INTRAVENOUS
  Filled 2019-01-15: qty 2

## 2019-01-15 MED ORDER — OXYCODONE-ACETAMINOPHEN 5-325 MG PO TABS: 1.0000 | ORAL_TABLET | Freq: Four times a day (QID) | ORAL | 0 refills | Status: DC | PRN

## 2019-01-15 MED ORDER — MORPHINE SULFATE (PF) 4 MG/ML IV SOLN
4.0000 mg | Freq: Once | INTRAVENOUS | Status: AC
  Administered 2019-01-15: 4 mg via INTRAVENOUS
  Filled 2019-01-15: qty 1

## 2019-01-15 NOTE — ED Provider Notes (Signed)
[facility] EMERGENCY DEPARTMENT Provider Note   CSN: [id number] Arrival date & time: 01/15/19  0106    History   Chief Complaint Chief Complaint  Patient presents with  . Abdominal Pain    HPI Rodney Meyers is a 63 y.o. male.     Patient is a 63 year old male with a past medical history of diabetes and asthma.  He presents today for evaluation of right flank and abdominal pain.  This started at approximately 6:00 this evening.  He describes generalized abdominal pain that radiates to the right flank.  He denies any blood in his urine.  He denies any dysuria.  He denies any fevers, chills, or bloody stools.  This feels similar to what he experienced 1 year ago when he was diagnosed with kidney stones.  The history is provided by the patient.  Abdominal Pain  Pain location:  Generalized Pain quality: cramping   Pain radiates to:  R flank Pain severity:  Moderate Onset quality:  Sudden Timing:  Constant Progression:  Worsening Chronicity:  New Relieved by:  Nothing Worsened by:  Nothing Ineffective treatments:  None tried   Past Medical History:  Diagnosis Date  . Asthma   . Diabetes mellitus without complication [facility])     Patient Active Problem List   Diagnosis Date Noted  . Hyperlipidemia LDL goal <100 07/24/2013  . Esophageal reflux 07/24/2013  . Diabetes (HCC) 07/13/2013  . Asthma, chronic 12/16/2012    Past Surgical History:  Procedure Laterality Date  . L. arm surgery          Home Medications    Prior to Admission medications   Medication Sig Start Date End Date Taking? Authorizing Provider  fluticasone (FLOVENT HFA) 44 MCG/ACT inhaler TAKE 2 PUFFS BY MOUTH TWICE A DAY 07/05/18  Yes Merlyn Albert, MD  glipiZIDE (GLUCOTROL) 5 MG tablet Take one qam and one half qpm 07/05/18  Yes Merlyn Albert, MD  pravastatin (PRAVACHOL) 80 MG tablet TAKE 1 TABLET BY MOUTH EVERY DAY 01/09/19  Yes Merlyn Albert, MD  albuterol (PROVENTIL) (2.5 MG/3ML)  0.083% nebulizer solution Take 3 mLs (2.5 mg total) by nebulization at bedtime as needed for wheezing or shortness of breath. 01/04/16   Merlyn Albert, MD  azithromycin (ZITHROMAX) 250 MG tablet Use as directed 07/27/18   Babs Sciara, MD  mometasone (ELOCON) 0.1 % lotion Apply topically daily. 06/18/14   Merlyn Albert, MD  oseltamivir (TAMIFLU) 75 MG capsule Take 1 capsule (75 mg total) by mouth 2 (two) times daily. 07/18/18   Babs Sciara, MD  tamsulosin (FLOMAX) 0.4 MG CAPS capsule TAKE 1 CAPSULE BY MOUTH EVERY DAY 12/25/18   Merlyn Albert, MD    Family History Family History  Problem Relation Age of Onset  . Heart attack Father     Social History Social History   Tobacco Use  . Smoking status: Never Smoker  . Smokeless tobacco: Never Used  Substance Use Topics  . Alcohol use: No  . Drug use: No     Allergies   Patient has no known allergies.   Review of Systems Review of Systems  Gastrointestinal: Positive for abdominal pain.  All other systems reviewed and are negative.    Physical Exam Updated Vital Signs BP (!) 192/85 (BP Location: Left Arm)   Pulse (!) 53   Temp 97.9 F (36.6 C) (Oral)   Resp 18   Ht 5\' 7"  (1.702 m)   Wt 79.4 kg  SpO2 99%   BMI 27.41 kg/m   Physical Exam Vitals signs and nursing note reviewed.  Constitutional:      General: He is not in acute distress.    Appearance: He is well-developed. He is not diaphoretic.  HENT:     Head: Normocephalic and atraumatic.  Neck:     Musculoskeletal: Normal range of motion and neck supple.  Cardiovascular:     Rate and Rhythm: Normal rate and regular rhythm.     Heart sounds: No murmur. No friction rub.  Pulmonary:     Effort: Pulmonary effort is normal. No respiratory distress.     Breath sounds: Normal breath sounds. No wheezing or rales.  Abdominal:     General: Bowel sounds are normal. There is no distension.     Palpations: Abdomen is soft.     Tenderness: There is  generalized abdominal tenderness. There is right CVA tenderness. There is no guarding or rebound.  Musculoskeletal: Normal range of motion.  Skin:    General: Skin is warm and dry.  Neurological:     Mental Status: He is alert and oriented to person, place, and time.     Coordination: Coordination normal.      ED Treatments / Results  Labs (all labs ordered are listed, but only abnormal results are displayed) Labs Reviewed  LIPASE, BLOOD  COMPREHENSIVE METABOLIC PANEL  CBC  URINALYSIS, ROUTINE W REFLEX MICROSCOPIC    EKG None  Radiology No results found.  Procedures Procedures (including critical care time)  Medications Ordered in ED Medications  ondansetron (ZOFRAN) injection 4 mg (has no administration in time range)  morphine 4 MG/ML injection 4 mg (has no administration in time range)  ketorolac (TORADOL) 30 MG/ML injection 30 mg (has no administration in time range)     Initial Impression / Assessment and Plan / ED Course  I have reviewed the triage vital signs and the nursing notes.  Pertinent labs & imaging results that were available during my care of the patient were reviewed by me and considered in my medical decision making (see chart for details).  Patient presenting with complaints of right flank pain.  This appears to be caused by a 9 mm stone in the distal ureter.  Patient feeling better after medications given in the ER.  He will be discharged with pain medication and is to follow-up with urology if not improving in the next few days.  Final Clinical Impressions(s) / ED Diagnoses   Final diagnoses:  None    ED Discharge Orders    None       Geoffery Lyonselo, Ralphie Lovelady, MD 01/15/19 [phone]

## 2019-01-15 NOTE — Discharge Instructions (Signed)
Percocet as prescribed as needed for pain.  Follow-up with urology if your symptoms are not improving in the next 3 days.  The contact information for [facility] here in [place] has been provided in this discharge summary for you to call and make these arrangements.  Return to the emergency department if you develop high fever, worsening pain, or other new and concerning symptoms.

## 2019-01-15 NOTE — ED Triage Notes (Addendum)
Pt with c/o diffuse abdominal pain since 1800 last night that radiates to R flank area. Pt with hx of kidney stones. Denies blood in urine or difficulty urinating. Pt states he has vomited x 3 tonight.

## 2019-01-17 ENCOUNTER — Other Ambulatory Visit: Payer: Self-pay | Admitting: Family Medicine

## 2019-01-17 NOTE — Telephone Encounter (Signed)
Please call pt to schedule appt. Route back to pool once visit made. Thank you

## 2019-01-18 NOTE — Telephone Encounter (Signed)
Attempted to call pt but no voicemail set up.

## 2019-01-29 ENCOUNTER — Other Ambulatory Visit: Payer: Self-pay | Admitting: Family Medicine

## 2019-01-29 NOTE — Telephone Encounter (Signed)
Call virt o v this week

## 2019-01-30 NOTE — Telephone Encounter (Signed)
Attempted to call pt no answer. Unable to lvm

## 2019-01-31 NOTE — Telephone Encounter (Signed)
APPT SCHEDULED FOR 02/01/19

## 2019-01-31 NOTE — Telephone Encounter (Signed)
Wife called and made the appt for 9:30 Friday morning. She said he gets 2 breaks a day, one around 9 or 9:30 and the other around 2 and he doesn't get off work until after 4. She said he will try to go outside closer to 9:30 tomorrow morning.

## 2019-02-01 ENCOUNTER — Other Ambulatory Visit: Payer: Self-pay | Admitting: Family Medicine

## 2019-02-01 ENCOUNTER — Other Ambulatory Visit: Payer: Self-pay

## 2019-02-01 ENCOUNTER — Ambulatory Visit: Payer: PRIVATE HEALTH INSURANCE | Admitting: Family Medicine

## 2019-02-01 NOTE — Telephone Encounter (Signed)
15 days worth

## 2019-02-01 NOTE — Telephone Encounter (Signed)
Patient rescheduled for 02/12/2019

## 2019-02-01 NOTE — Telephone Encounter (Signed)
15 d

## 2019-02-09 ENCOUNTER — Other Ambulatory Visit: Payer: Self-pay | Admitting: Family Medicine

## 2019-02-11 ENCOUNTER — Ambulatory Visit: Payer: PRIVATE HEALTH INSURANCE | Admitting: Family Medicine

## 2019-02-11 NOTE — Telephone Encounter (Signed)
Has appt tomorrow will refill then

## 2019-02-12 ENCOUNTER — Ambulatory Visit: Payer: PRIVATE HEALTH INSURANCE | Admitting: Family Medicine

## 2019-02-12 ENCOUNTER — Other Ambulatory Visit: Payer: Self-pay

## 2019-02-12 NOTE — Telephone Encounter (Signed)
Pt rescheduled appt til Monday because he is off. Last med check was 2018

## 2019-02-12 NOTE — Telephone Encounter (Signed)
Seven days each

## 2019-02-18 ENCOUNTER — Ambulatory Visit (INDEPENDENT_AMBULATORY_CARE_PROVIDER_SITE_OTHER): Payer: PRIVATE HEALTH INSURANCE | Admitting: Family Medicine

## 2019-02-18 ENCOUNTER — Other Ambulatory Visit: Payer: Self-pay

## 2019-02-18 DIAGNOSIS — E119 Type 2 diabetes mellitus without complications: Secondary | ICD-10-CM

## 2019-02-18 DIAGNOSIS — R350 Frequency of micturition: Secondary | ICD-10-CM | POA: Diagnosis not present

## 2019-02-18 DIAGNOSIS — E785 Hyperlipidemia, unspecified: Secondary | ICD-10-CM | POA: Diagnosis not present

## 2019-02-18 DIAGNOSIS — J4530 Mild persistent asthma, uncomplicated: Secondary | ICD-10-CM

## 2019-02-18 MED ORDER — FLOVENT HFA 44 MCG/ACT IN AERO: INHALATION_SPRAY | RESPIRATORY_TRACT | 5 refills | Status: DC

## 2019-02-18 MED ORDER — TAMSULOSIN HCL 0.4 MG PO CAPS: 0.4000 mg | ORAL_CAPSULE | Freq: Every day | ORAL | 5 refills | Status: DC

## 2019-02-18 MED ORDER — GLIPIZIDE 5 MG PO TABS: ORAL_TABLET | ORAL | 5 refills | Status: DC

## 2019-02-18 MED ORDER — PANTOPRAZOLE SODIUM 40 MG PO TBEC: 40.0000 mg | DELAYED_RELEASE_TABLET | Freq: Every day | ORAL | 5 refills | Status: DC

## 2019-02-18 MED ORDER — ALBUTEROL SULFATE (2.5 MG/3ML) 0.083% IN NEBU: 2.5000 mg | INHALATION_SOLUTION | Freq: Every evening | RESPIRATORY_TRACT | 1 refills | Status: DC | PRN

## 2019-02-18 MED ORDER — PRAVASTATIN SODIUM 80 MG PO TABS: 80.0000 mg | ORAL_TABLET | Freq: Every day | ORAL | 5 refills | Status: DC

## 2019-02-18 NOTE — Progress Notes (Signed)
   Subjective:  Audio plus video  Patient ID: Rodney Meyers, male    DOB: 09/26/1955, 63 y.o.   MRN: [id number]  Diabetes He presents for his follow-up diabetic visit. He has type 2 diabetes mellitus. He is compliant with treatment all of the time.   Trouble with acid reflux.   Went to ED on 6/2 for kidney stones. States no trouble since going to the hospital.   Virtual Visit via Telephone Note  I connected with Rodney Meyers on 02/18/19 at  3:00 PM EDT by telephone and verified that I am speaking with the correct person using two identifiers.  Location: Patient: home Provider: office   I discussed the limitations, risks, security and privacy concerns of performing an evaluation and management service by telephone and the availability of in person appointments. I also discussed with the patient that there may be a patient responsible charge related to this service. The patient expressed understanding and agreed to proceed.   History of Present Illness:    Observations/Objective:   Assessment and Plan:   Follow Up Instructions:    I discussed the assessment and treatment plan with the patient. The patient was provided an opportunity to ask questions and all were answered. The patient agreed with the plan and demonstrated an understanding of the instructions.   The patient was advised to call back or seek an in-person evaluation if the symptoms worsen or if the condition fails to improve as anticipated.  I provided  25 minutes of non-face-to-face time during this encounter.   Patient claims compliance with diabetes medication. No obvious side effects. Reports no substantial low sugar spells. Most numbers are generally in good range when checked fasting. Generally does not miss a dose of medication. Watching diabetic diet closely  Patient continues to take lipid medication regularly. No obvious side effects from it. Generally does not miss a dose. Prior blood work results  are reviewed with patient. Patient continues to work on fat intake in diet  Patient noting substantial challenges with prostate hypertrophy.  Interested in the Flomax.  Asthma overall stable.  Still uses preventive agent.  States it definitely helps.    Review of Systems No headache, no major weight loss or weight gain, no chest pain no back pain abdominal pain no change in bowel habits complete ROS otherwise negative     Objective:   Physical Exam   Virtual     Assessment & Plan:  Impression 1 type 2 diabetes apparent good control discussed to maintain same meds  2.  Hyperlipidemia.  Current status uncertain prior blood work reviewed maintain same  3.  Reflux discussed Protonix to take daily  4.  Asthma clinically stable  Follow-up in 6 months diet exercise discussed medications refilled

## 2019-02-24 ENCOUNTER — Encounter: Payer: Self-pay | Admitting: Family Medicine

## 2019-03-27 ENCOUNTER — Ambulatory Visit: Payer: PRIVATE HEALTH INSURANCE | Admitting: Family Medicine

## 2019-04-03 ENCOUNTER — Ambulatory Visit (INDEPENDENT_AMBULATORY_CARE_PROVIDER_SITE_OTHER): Payer: PRIVATE HEALTH INSURANCE | Admitting: Family Medicine

## 2019-04-03 ENCOUNTER — Encounter: Payer: Self-pay | Admitting: Family Medicine

## 2019-04-03 ENCOUNTER — Other Ambulatory Visit: Payer: Self-pay

## 2019-04-03 DIAGNOSIS — M79641 Pain in right hand: Secondary | ICD-10-CM | POA: Diagnosis not present

## 2019-04-03 MED ORDER — DICLOFENAC SODIUM 75 MG PO TBEC: 75.0000 mg | DELAYED_RELEASE_TABLET | Freq: Two times a day (BID) | ORAL | 2 refills | Status: DC | PRN

## 2019-04-03 NOTE — Progress Notes (Signed)
   Subjective:    Patient ID: Rodney Meyers, male    DOB: 24-Apr-1956, 63 y.o.   MRN: [id number]  HPI  Patient calls with bilateral hand pain for a long time. Patient thinks it is arthritis and wants to see a specialist.  Virtual Visit via Video Note  I connected with Rodney Meyers on 04/03/19 at  8:30 AM EDT by a video enabled telemedicine application and verified that I am speaking with the correct person using two identifiers.  Location: Patient: home Provider: office   I discussed the limitations of evaluation and management by telemedicine and the availability of in person appointments. The patient expressed understanding and agreed to proceed.  History of Present Illness:    Observations/Objective:   Assessment and Plan:   Follow Up Instructions:    I discussed the assessment and treatment plan with the patient. The patient was provided an opportunity to ask questions and all were answered. The patient agreed with the plan and demonstrated an understanding of the instructions.   The patient was advised to call back or seek an in-person evaluation if the symptoms worsen or if the condition fails to improve as anticipated.  I provided 19 minutes of non-face-to-face time during this encounter.  Patient notes progressive and pain over the past year.  Right more than left.  Right is dominant.  Hurts throughout the day while working.  Does not wake up with it.  No associated numbness or tingling.  No obvious deformity.  No obvious swelling recalls no injury  Patient frustrated by request specialist  Review of Systems No headache, no major weight loss or weight gain, no chest pain no back pain abdominal pain no change in bowel habits complete ROS otherwise negative     Objective:   Physical Exam   Virtual     Assessment & Plan:  Impression hand pain.  Does not sound neuropathic.  Will do x-ray and blood work to assess orthopedic versus rheumatological.  Then  when results are back we will refer as per patient request

## 2019-07-30 ENCOUNTER — Ambulatory Visit (INDEPENDENT_AMBULATORY_CARE_PROVIDER_SITE_OTHER): Payer: PRIVATE HEALTH INSURANCE | Admitting: Family Medicine

## 2019-07-30 ENCOUNTER — Other Ambulatory Visit: Payer: Self-pay

## 2019-07-30 ENCOUNTER — Encounter: Payer: Self-pay | Admitting: Family Medicine

## 2019-07-30 DIAGNOSIS — Z20822 Contact with and (suspected) exposure to covid-19: Secondary | ICD-10-CM

## 2019-07-30 DIAGNOSIS — Z20828 Contact with and (suspected) exposure to other viral communicable diseases: Secondary | ICD-10-CM

## 2019-07-30 DIAGNOSIS — J4530 Mild persistent asthma, uncomplicated: Secondary | ICD-10-CM | POA: Diagnosis not present

## 2019-07-30 MED ORDER — ALBUTEROL SULFATE HFA 108 (90 BASE) MCG/ACT IN AERS: INHALATION_SPRAY | RESPIRATORY_TRACT | 0 refills | Status: DC

## 2019-07-30 NOTE — Progress Notes (Signed)
   Subjective:  Audio plus video  Patient ID: TRICIA OAXACA, male    DOB: 05-02-1956, 63 y.o.   MRN: [id number]  Cough This is a new problem. Episode onset: 2 days. Associated symptoms include a fever and headaches. Associated symptoms comments: congestion. Treatments tried: nyquil, tylenol rapid relief.   Virtual Visit via Telephone Note  I connected with [place] on 07/30/19 at 10:00 AM EST by telephone and verified that I am speaking with the correct person using two identifiers.  Location: Patient: home Provider: office   I discussed the limitations, risks, security and privacy concerns of performing an evaluation and management service by telephone and the availability of in person appointments. I also discussed with the patient that there may be a patient responsible charge related to this service. The patient expressed understanding and agreed to proceed.   History of Present Illness:    Observations/Objective:   Assessment and Plan:   Follow Up Instructions:    I discussed the assessment and treatment plan with the patient. The patient was provided an opportunity to ask questions and all were answered. The patient agreed with the plan and demonstrated an understanding of the instructions.   The patient was advised to call back or seek an in-person evaluation if the symptoms worsen or if the condition fails to improve as anticipated.  I provided 18 minutes of non-face-to-face time during this encounter.   Started sun   Pain in the body   Slight cough   Lo gr fever  pts spouse     Review of Systems  Constitutional: Positive for fever.  Respiratory: Positive for cough.   Neurological: Positive for headaches.       Objective:   Physical Exam   Virtual     Assessment & Plan:  Impression acute respiratory illness.  Between achiness fever headaches and cough significant potential for Covid discussed.  Precautionary measures discussed.   Testing recommended.  Refill of albuterol this time and the metered-dose inhaler form proper use discussed warning signs discussed

## 2019-08-01 ENCOUNTER — Ambulatory Visit: Payer: BC Managed Care – PPO | Attending: Internal Medicine

## 2019-08-01 ENCOUNTER — Other Ambulatory Visit: Payer: Self-pay

## 2019-08-01 DIAGNOSIS — Z20822 Contact with and (suspected) exposure to covid-19: Secondary | ICD-10-CM

## 2019-08-02 LAB — NOVEL CORONAVIRUS, NAA: SARS-CoV-2, NAA: DETECTED — AB

## 2019-08-05 ENCOUNTER — Other Ambulatory Visit: Payer: Self-pay

## 2019-08-05 ENCOUNTER — Telehealth: Payer: Self-pay | Admitting: *Deleted

## 2019-08-05 ENCOUNTER — Ambulatory Visit (INDEPENDENT_AMBULATORY_CARE_PROVIDER_SITE_OTHER): Payer: PRIVATE HEALTH INSURANCE | Admitting: Family Medicine

## 2019-08-05 DIAGNOSIS — U071 COVID-19: Secondary | ICD-10-CM

## 2019-08-05 MED ORDER — DICLOFENAC SODIUM 75 MG PO TBEC: 75.0000 mg | DELAYED_RELEASE_TABLET | Freq: Two times a day (BID) | ORAL | 1 refills | Status: DC | PRN

## 2019-08-05 NOTE — Progress Notes (Signed)
   Subjective:  Audio plus video  Patient ID: Rodney Meyers, male    DOB: August 17, 1955, 63 y.o.   MRN: [id number]  HPI  Patient calls to discuss positive Covid results last week. Patient is feeling better and having no complications but is still upset about his results.  Virtual Visit via Video Note  I connected with [place] on 08/05/19 at  3:00 PM EST by a video enabled telemedicine application and verified that I am speaking with the correct person using two identifiers.  Location: Patient: home Provider: office   I discussed the limitations of evaluation and management by telemedicine and the availability of in person appointments. The patient expressed understanding and agreed to proceed.  History of Present Illness:    Observations/Objective:   Assessment and Plan:   Follow Up Instructions:    I discussed the assessment and treatment plan with the patient. The patient was provided an opportunity to ask questions and all were answered. The patient agreed with the plan and demonstrated an understanding of the instructions.   The patient was advised to call back or seek an in-person evaluation if the symptoms worsen or if the condition fails to improve as anticipated.  I provided 18 minutes of non-face-to-face time during this encounter.  Patient has known history of COVID-19.  Unfortunately comorbidities such as asthma and diabetes which puts him at greater risk.  Also 63 years of age.  On his eighth day of illness.  Overall feeling better the last few days.  Less achiness.  Less cough.  No fever.  Energy improving somewhat.  Still notes body aches at times.  Fortunately asthma has not acted up significantly   Review of Systems No vomiting no diarrhea no rash no high fevers    Objective:   Physical Exam  Virtual      Assessment & Plan:  Impression COVID-19 infection.  Educated.  Discussed potential impact.  Long-term concerns.  Short-term  interventions and warning signs discussed

## 2019-08-05 NOTE — Telephone Encounter (Signed)
covid test was positive and he was notified on Saturday by testing site. Called to see how he was doing and wife states Friday and Saturday he was having a lot of back pain but yesterday and today he is doing much better. She states she is starting to have the same symptoms he has and wanted appt today. Transferred to the front to schedule a virtual appt today.

## 2019-08-12 ENCOUNTER — Telehealth: Payer: Self-pay | Admitting: Family Medicine

## 2019-08-12 ENCOUNTER — Other Ambulatory Visit: Payer: Self-pay | Admitting: Family Medicine

## 2019-08-12 NOTE — Telephone Encounter (Signed)
Please advise. Thank you

## 2019-08-12 NOTE — Telephone Encounter (Signed)
Please give work note and send labs. Thank you

## 2019-08-12 NOTE — Telephone Encounter (Signed)
Ok lets 

## 2019-08-12 NOTE — Telephone Encounter (Signed)
Patient is requesting work note from 07/29/2019-1/321 and returning to work on 08/19/19. He would also like copy of Covid test for work. Please advise

## 2019-08-13 ENCOUNTER — Encounter: Payer: Self-pay | Admitting: Family Medicine

## 2019-08-14 ENCOUNTER — Ambulatory Visit
Admission: EM | Admit: 2019-08-14 | Discharge: 2019-08-14 | Disposition: A | Discharge status: Home / Self Care | Payer: BC Managed Care – PPO | Attending: Emergency Medicine | Admitting: Emergency Medicine

## 2019-08-14 ENCOUNTER — Other Ambulatory Visit: Payer: Self-pay

## 2019-08-14 DIAGNOSIS — R109 Unspecified abdominal pain: Secondary | ICD-10-CM | POA: Diagnosis not present

## 2019-08-14 DIAGNOSIS — Z87442 Personal history of urinary calculi: Secondary | ICD-10-CM

## 2019-08-14 MED ORDER — ONDANSETRON HCL 4 MG PO TABS: 4.0000 mg | ORAL_TABLET | Freq: Four times a day (QID) | ORAL | 0 refills | Status: DC

## 2019-08-14 MED ORDER — KETOROLAC TROMETHAMINE 60 MG/2ML IM SOLN: 60.0000 mg | Freq: Once | INTRAMUSCULAR | Status: DC

## 2019-08-14 MED ORDER — TRAMADOL HCL 50 MG PO TABS: 50.0000 mg | ORAL_TABLET | Freq: Two times a day (BID) | ORAL | 0 refills | Status: DC | PRN

## 2019-08-14 MED ORDER — ONDANSETRON 4 MG PO TBDP: 4.0000 mg | ORAL_TABLET | Freq: Once | ORAL | Status: DC

## 2019-08-14 MED ORDER — IBUPROFEN 800 MG PO TABS: 800.0000 mg | ORAL_TABLET | Freq: Three times a day (TID) | ORAL | 0 refills | Status: DC

## 2019-08-14 NOTE — ED Triage Notes (Signed)
Pt presents with right side flank pain that began this morning. Pt began vomiting during initial assessment

## 2019-08-14 NOTE — Discharge Instructions (Addendum)
Toradol shot given in office Zofran given in office Drink plenty of fluids and get rest Continue with flomax as prescribed Zofran as needed for nausea and/or vomiting Ibuprofen 800 mg prescribed take as directed for pain Tramadol for severe break-through pain.  DO NOT TAKE prior to driving or operating heavy machinery Follow up with PCP or urologist for further evaluation and management Return or go to ER if you have any new or worsening symptoms (difficulty urinating, blood in urine, pain that does not moderate with medication, fever, chills, abdominal pain, etc...)  

## 2019-08-14 NOTE — ED Provider Notes (Signed)
[facility]   CC: RT flank pain  SUBJECTIVE:  Rodney Meyers is a 63 y.o. male who complains of RT sided flank pain that began this morning.  Patient denies a precipitating event, or trauma.  Localizes the pain to the RT flank.  Pain is constant and describes it as sharp.  Has tried OTC medications without relief.  Denies aggravating factors.  Admits to similar symptoms in the past and diagnosed with kidney stone.  Complains of associated nausea, and vomiting.  Denies fever, chills, CP, SOB, changes in bowel habits, dysuria, urinary frequency, urinary urgency, oliguria, anuria, hematuria.  Las BM this morning, smaller than usual   ROS: As in HPI.  All other pertinent ROS negative.     Past Medical History:  Diagnosis Date  . Asthma   . Diabetes mellitus without complication [facility])    Past Surgical History:  Procedure Laterality Date  . L. arm surgery     No Known Allergies No current facility-administered medications on file prior to encounter.   Current Outpatient Medications on File Prior to Encounter  Medication Sig Dispense Refill  . albuterol (VENTOLIN HFA) 108 (90 Base) MCG/ACT inhaler 2 sprays qid prn wheezing 18 g 0  . diclofenac (VOLTAREN) 75 MG EC tablet Take 1 tablet (75 mg total) by mouth 2 (two) times daily as needed. With food 28 tablet 1  . fluticasone (FLOVENT HFA) 44 MCG/ACT inhaler TAKE 2 PUFFS BY MOUTH TWICE A DAY 10.6 Inhaler 5  . glipiZIDE (GLUCOTROL) 5 MG tablet Take one qam and one half qpm 45 tablet 5  . oxyCODONE-acetaminophen (PERCOCET) 5-325 MG tablet Take 1-2 tablets by mouth every 6 (six) hours as needed. (Patient not taking: Reported on 02/18/2019) 20 tablet 0  . pantoprazole (PROTONIX) 40 MG tablet Take 1 tablet (40 mg total) by mouth daily. 30 tablet 5  . pravastatin (PRAVACHOL) 80 MG tablet Take 1 tablet (80 mg total) by mouth daily. 30 tablet 5  . tamsulosin (FLOMAX) 0.4 MG CAPS capsule Take 1 capsule (0.4 mg total) by mouth daily. 30  capsule 5   Social History   Socioeconomic History  . Marital status: Married    Spouse name: Not on file  . Number of children: Not on file  . Years of education: Not on file  . Highest education level: Not on file  Occupational History  . Not on file  Tobacco Use  . Smoking status: Never Smoker  . Smokeless tobacco: Never Used  Substance and Sexual Activity  . Alcohol use: No  . Drug use: No  . Sexual activity: Not on file  Other Topics Concern  . Not on file  Social History Narrative  . Not on file   Social Determinants of Health   Financial Resource Strain:   . Difficulty of Paying Living Expenses: Not on file  Food Insecurity:   . Worried About Charity fundraiser in the Last Year: Not on file  . Ran Out of Food in the Last Year: Not on file  Transportation Needs:   . Lack of Transportation (Medical): Not on file  . Lack of Transportation (Non-Medical): Not on file  Physical Activity:   . Days of Exercise per Week: Not on file  . Minutes of Exercise per Session: Not on file  Stress:   . Feeling of Stress : Not on file  Social Connections:   . Frequency of Communication with Friends and Family: Not on file  . Frequency of Social  Gatherings with Friends and Family: Not on file  . Attends Religious Services: Not on file  . Active Member of Clubs or Organizations: Not on file  . Attends Banker Meetings: Not on file  . Marital Status: Not on file  Intimate Partner Violence:   . Fear of Current or Ex-Partner: Not on file  . Emotionally Abused: Not on file  . Physically Abused: Not on file  . Sexually Abused: Not on file   Family History  Problem Relation Age of Onset  . Heart attack Father     OBJECTIVE:  Vitals:   08/14/19 1131  BP: (!) 182/95  Pulse: 60  Resp: 20  Temp: 98 F (36.7 C)  SpO2: 98%   General appearance: Alert in no acute distress, but appears uncomfortable, leaning forward in exam chair HEENT: NCAT.  Oropharynx clear.    Lungs: clear to auscultation bilaterally without adventitious breath sounds Heart: regular rate and rhythm.   Abdomen: soft; non-distended; bowel sounds present; mild diffuse TTP over RT flank; negative murphy's; no guarding Back: no CVA tenderness Extremities: no edema; symmetrical with no gross deformities Skin: warm and dry Neurologic: Ambulates from chair to exam table without difficulty Psychological: alert and cooperative; normal mood and affect  ASSESSMENT & PLAN:  1. Right flank pain   2. History of kidney stones     Meds ordered this encounter  Medications  . ketorolac (TORADOL) injection 60 mg  . ondansetron (ZOFRAN-ODT) disintegrating tablet 4 mg  . ibuprofen (ADVIL) 800 MG tablet    Sig: Take 1 tablet (800 mg total) by mouth 3 (three) times daily.    Dispense:  21 tablet    Refill:  0    Order Specific Question:   Supervising Provider    Answer:   Eustace Moore [[id number]]  . ondansetron (ZOFRAN) 4 MG tablet    Sig: Take 1 tablet (4 mg total) by mouth every 6 (six) hours.    Dispense:  12 tablet    Refill:  0    Order Specific Question:   Supervising Provider    Answer:   Eustace Moore [[id number]]  . traMADol (ULTRAM) 50 MG tablet    Sig: Take 1 tablet (50 mg total) by mouth every 12 (twelve) hours as needed.    Dispense:  15 tablet    Refill:  0    Order Specific Question:   Supervising Provider    Answer:   Eustace Moore [[id number]]   Toradol shot given in office Zofran given in office Drink plenty of fluids and get rest Continue with flomax as prescribed Zofran as needed for nausea and/or vomiting Ibuprofen 800 mg prescribed take as directed for pain Tramadol for severe break-through pain.  DO NOT TAKE prior to driving or operating heavy machinery Follow up with PCP or urologist for further evaluation and management Return or go to ER if you have any new or worsening symptoms (difficulty urinating, blood in urine, pain that does not moderate  with medication, fever, chills, abdominal pain, etc...)   Outlined signs and symptoms indicating need for more acute intervention. Patient verbalized understanding. After Visit Summary given.     [name], PA-C 08/14/19 1155

## 2019-08-22 NOTE — Telephone Encounter (Signed)
error 

## 2019-08-26 DIAGNOSIS — Z029 Encounter for administrative examinations, unspecified: Secondary | ICD-10-CM

## 2019-08-27 ENCOUNTER — Telehealth: Payer: Self-pay | Admitting: Family Medicine

## 2019-08-27 NOTE — Telephone Encounter (Signed)
Patient had wife to drop off two forms to be completed in your yellow folder. I completed what I could ,please review and complete highlighted areas on both forms date and sign.

## 2019-09-03 NOTE — Telephone Encounter (Signed)
Forms are upfront for pickup/fax waiting on payment for forms before they can be faxed.

## 2019-09-04 ENCOUNTER — Other Ambulatory Visit: Payer: Self-pay | Admitting: Family Medicine

## 2019-09-19 ENCOUNTER — Encounter: Payer: Self-pay | Admitting: Family Medicine

## 2019-11-18 ENCOUNTER — Other Ambulatory Visit: Payer: Self-pay | Admitting: Family Medicine

## 2020-02-23 ENCOUNTER — Other Ambulatory Visit: Payer: Self-pay | Admitting: Family Medicine

## 2020-02-25 NOTE — Telephone Encounter (Signed)
Last seen for inhaler 07/30/19

## 2020-02-25 NOTE — Telephone Encounter (Signed)
appt in sept to establish care. Thx. Dr. Ladona Ridgel

## 2020-02-27 ENCOUNTER — Other Ambulatory Visit: Payer: Self-pay | Admitting: *Deleted

## 2020-02-28 MED ORDER — GLIPIZIDE 5 MG PO TABS: ORAL_TABLET | ORAL | 0 refills | Status: DC

## 2020-02-28 NOTE — Telephone Encounter (Signed)
Needs appt in next 30 days and labs. pls order cbc, cmp, lipids nad a1c, and urine microalbumin.  Dr. Ladona Ridgel

## 2020-02-28 NOTE — Telephone Encounter (Signed)
lvm to schedule appt.  

## 2020-02-28 NOTE — Telephone Encounter (Signed)
Please schedule and then route back.  

## 2020-03-02 NOTE — Telephone Encounter (Signed)
lvm to schedule appt.  

## 2020-03-17 ENCOUNTER — Ambulatory Visit
Admission: EM | Admit: 2020-03-17 | Discharge: 2020-03-17 | Disposition: A | Discharge status: Home / Self Care | Payer: 59 | Attending: Emergency Medicine | Admitting: Emergency Medicine

## 2020-03-17 ENCOUNTER — Other Ambulatory Visit: Payer: Self-pay

## 2020-03-17 DIAGNOSIS — R109 Unspecified abdominal pain: Secondary | ICD-10-CM

## 2020-03-17 DIAGNOSIS — N200 Calculus of kidney: Secondary | ICD-10-CM

## 2020-03-17 MED ORDER — KETOROLAC TROMETHAMINE 60 MG/2ML IM SOLN
60.0000 mg | Freq: Once | INTRAMUSCULAR | Status: AC
  Administered 2020-03-17: 60 mg via INTRAMUSCULAR

## 2020-03-17 MED ORDER — IBUPROFEN 800 MG PO TABS: 800.0000 mg | ORAL_TABLET | Freq: Three times a day (TID) | ORAL | 0 refills | Status: DC

## 2020-03-17 MED ORDER — TRAMADOL HCL 50 MG PO TABS: 50.0000 mg | ORAL_TABLET | Freq: Two times a day (BID) | ORAL | 0 refills | Status: DC | PRN

## 2020-03-17 MED ORDER — ONDANSETRON HCL 4 MG PO TABS: 4.0000 mg | ORAL_TABLET | Freq: Four times a day (QID) | ORAL | 0 refills | Status: DC

## 2020-03-17 NOTE — ED Provider Notes (Signed)
[facility]   CC: Flank pain  SUBJECTIVE:  Rodney Meyers is a 64 y.o. male who complains of bilateral flank pain x 1 day.  Past hx of kidney stones.  Feels similar to kidney stones in the past.   Localizes the pain to the bilateral flank.  Pain is constant and describes it as sharp.  Has NOT tried OTC medications.  Denies aggravating factors.  Admits to similar symptoms in the past with kidney stones that improved with toradol shot, ibuprofen and zofran with relief.  Denies fever, chills, vomiting, abdominal pain, hematuria, dysuria, urinary frequency, urinary urgency.    ROS: As in HPI.  All other pertinent ROS negative.     Past Medical History:  Diagnosis Date  . Asthma   . Diabetes mellitus without complication [facility])    Past Surgical History:  Procedure Laterality Date  . L. arm surgery     No Known Allergies No current facility-administered medications on file prior to encounter.   Current Outpatient Medications on File Prior to Encounter  Medication Sig Dispense Refill  . albuterol (VENTOLIN HFA) 108 (90 Base) MCG/ACT inhaler 2 PUFFS 4 TIMES A DAY AS NEEDED FOR WHEEZING 18 g 3  . diclofenac (VOLTAREN) 75 MG EC tablet Take 1 tablet (75 mg total) by mouth 2 (two) times daily as needed. With food 28 tablet 1  . FLOVENT HFA 44 MCG/ACT inhaler TAKE 2 PUFFS BY MOUTH TWICE A DAY 10.6 Inhaler 2  . glipiZIDE (GLUCOTROL) 5 MG tablet Take one qam and one half qpm 45 tablet 0  . pantoprazole (PROTONIX) 40 MG tablet Take 1 tablet (40 mg total) by mouth daily. 30 tablet 5  . pravastatin (PRAVACHOL) 80 MG tablet TAKE 1 TABLET BY MOUTH EVERY DAY 90 tablet 1  . tamsulosin (FLOMAX) 0.4 MG CAPS capsule TAKE 1 CAPSULE BY MOUTH EVERY DAY 90 capsule 1   Social History   Socioeconomic History  . Marital status: Married    Spouse name: Not on file  . Number of children: Not on file  . Years of education: Not on file  . Highest education level: Not on file  Occupational History    . Not on file  Tobacco Use  . Smoking status: Never Smoker  . Smokeless tobacco: Never Used  Substance and Sexual Activity  . Alcohol use: No  . Drug use: No  . Sexual activity: Not on file  Other Topics Concern  . Not on file  Social History Narrative  . Not on file   Social Determinants of Health   Financial Resource Strain:   . Difficulty of Paying Living Expenses:   Food Insecurity:   . Worried About Programme researcher, broadcasting/film/video in the Last Year:   . Barista in the Last Year:   Transportation Needs:   . Freight forwarder (Medical):   Marland Kitchen Lack of Transportation (Non-Medical):   Physical Activity:   . Days of Exercise per Week:   . Minutes of Exercise per Session:   Stress:   . Feeling of Stress :   Social Connections:   . Frequency of Communication with Friends and Family:   . Frequency of Social Gatherings with Friends and Family:   . Attends Religious Services:   . Active Member of Clubs or Organizations:   . Attends Banker Meetings:   Marland Kitchen Marital Status:   Intimate Partner Violence:   . Fear of Current or Ex-Partner:   . Emotionally Abused:   .  Physically Abused:   . Sexually Abused:    Family History  Problem Relation Age of Onset  . Heart attack Father     OBJECTIVE:  Vitals:   03/17/20 0926  BP: (!) 151/89  Pulse: 68  Resp: 17  Temp: 97.9 F (36.6 C)  TempSrc: Oral  SpO2: 97%   General appearance: Alert in no acute distress HEENT: NCAT.  Oropharynx clear.  Lungs: clear to auscultation bilaterally without adventitious breath sounds Heart: regular rate and rhythm.   Abdomen: soft; non-distended; no tenderness; bowel sounds present; no guarding Back: + CVA tenderness Extremities: no edema; symmetrical with no gross deformities Skin: warm and dry Neurologic: Ambulates from chair to exam table without difficulty Psychological: alert and cooperative; normal mood and affect  ASSESSMENT & PLAN:  1. Flank pain   2. Kidney stones      Meds ordered this encounter  Medications  . ketorolac (TORADOL) injection 60 mg  . ibuprofen (ADVIL) 800 MG tablet    Sig: Take 1 tablet (800 mg total) by mouth 3 (three) times daily.    Dispense:  21 tablet    Refill:  0    Order Specific Question:   Supervising Provider    Answer:   Eustace Moore [[id number]]  . ondansetron (ZOFRAN) 4 MG tablet    Sig: Take 1 tablet (4 mg total) by mouth every 6 (six) hours.    Dispense:  12 tablet    Refill:  0    Order Specific Question:   Supervising Provider    Answer:   Eustace Moore [[id number]]  . traMADol (ULTRAM) 50 MG tablet    Sig: Take 1 tablet (50 mg total) by mouth every 12 (twelve) hours as needed.    Dispense:  10 tablet    Refill:  0    Order Specific Question:   Supervising Provider    Answer:   Eustace Moore [[id number]]   Toradol shot given in office Zofran given in office Drink plenty of fluids and get rest Continue with flomax as prescribed Zofran as needed for nausea and/or vomiting Ibuprofen 800 mg prescribed take as directed for pain Tramadol for severe break-through pain.  DO NOT TAKE prior to driving or operating heavy machinery Follow up with PCP or urologist for further evaluation and management Return or go to ER if you have any new or worsening symptoms (difficulty urinating, blood in urine, pain that does not moderate with medication, fever, chills, abdominal pain, etc...)   Outlined signs and symptoms indicating need for more acute intervention. Patient verbalized understanding. After Visit Summary given.     Alvino Chapel Belle, PA-C 03/17/20 [phone]

## 2020-03-17 NOTE — Discharge Instructions (Signed)
Toradol shot given in office Zofran given in office Drink plenty of fluids and get rest Continue with flomax as prescribed Zofran as needed for nausea and/or vomiting Ibuprofen 800 mg prescribed take as directed for pain Tramadol for severe break-through pain.  DO NOT TAKE prior to driving or operating heavy machinery Follow up with PCP or urologist for further evaluation and management Return or go to ER if you have any new or worsening symptoms (difficulty urinating, blood in urine, pain that does not moderate with medication, fever, chills, abdominal pain, etc...)

## 2020-03-17 NOTE — ED Triage Notes (Signed)
Pt returns with c/o right flank pain, has h/p multiple kidney stones

## 2020-03-23 ENCOUNTER — Other Ambulatory Visit: Payer: Self-pay

## 2020-03-23 ENCOUNTER — Ambulatory Visit
Admission: EM | Admit: 2020-03-23 | Discharge: 2020-03-23 | Disposition: A | Discharge status: Home / Self Care | Payer: 59 | Attending: Emergency Medicine | Admitting: Emergency Medicine

## 2020-03-23 DIAGNOSIS — K5909 Other constipation: Secondary | ICD-10-CM | POA: Diagnosis present

## 2020-03-23 DIAGNOSIS — R1084 Generalized abdominal pain: Secondary | ICD-10-CM

## 2020-03-23 LAB — POCT URINALYSIS DIP (MANUAL ENTRY)
Bilirubin, UA: NEGATIVE
Glucose, UA: >=1000 mg/dL — AB
Ketones, POC UA: NEGATIVE mg/dL
Leukocytes, UA: NEGATIVE
Nitrite, UA: NEGATIVE
Spec Grav, UA: 1.02 (ref 1.010–1.025)
Urobilinogen, UA: 0.2 E.U./dL
pH, UA: 5 (ref 5.0–8.0)

## 2020-03-23 LAB — POCT FASTING CBG KUC MANUAL ENTRY: POCT Glucose (KUC): 295 mg/dL — AB (ref 70–99)

## 2020-03-23 MED ORDER — POLYETHYLENE GLYCOL 3350 17 GM/SCOOP PO POWD: ORAL | 3 refills | Status: AC

## 2020-03-23 NOTE — ED Provider Notes (Signed)
[facility]   [id number] 03/23/20 Arrival Time: 0804  Chief Complaint  Patient presents with  . Abdominal Pain     SUBJECTIVE:  Rodney Meyers is a 64 y.o. male with history of constipation and kidney stone presents to the urgent care with a complaint of abdominal pain that started last night.  Denies a precipitating event, trauma, close contacts with similar symptoms, recent travel or antibiotic use.  Localizes pain to generalized abdomen.  Describes as intermittent and cramping and achy in character.  Has Pepto-Bismol without relief.  Denies alleviating or aggravating factors.  Denies similar symptoms in the past.  Last BM 03/22/20.  Reported it was hard and very small. Denies fever, chills, appetite changes, weight changes, nausea, vomiting, chest pain, SOB, diarrhea,  hematochezia, melena, dysuria, difficulty urinating, increased frequency or urgency, loss of bowel or bladder function.     No LMP for male patient.  ROS: As per HPI.  All other pertinent ROS negative.     Past Medical History:  Diagnosis Date  . Asthma   . Diabetes mellitus without complication [facility])    Past Surgical History:  Procedure Laterality Date  . L. arm surgery     No Known Allergies No current facility-administered medications on file prior to encounter.   Current Outpatient Medications on File Prior to Encounter  Medication Sig Dispense Refill  . albuterol (VENTOLIN HFA) 108 (90 Base) MCG/ACT inhaler 2 PUFFS 4 TIMES A DAY AS NEEDED FOR WHEEZING 18 g 3  . diclofenac (VOLTAREN) 75 MG EC tablet Take 1 tablet (75 mg total) by mouth 2 (two) times daily as needed. With food 28 tablet 1  . FLOVENT HFA 44 MCG/ACT inhaler TAKE 2 PUFFS BY MOUTH TWICE A DAY 10.6 Inhaler 2  . glipiZIDE (GLUCOTROL) 5 MG tablet Take one qam and one half qpm 45 tablet 0  . ibuprofen (ADVIL) 800 MG tablet Take 1 tablet (800 mg total) by mouth 3 (three) times daily. 21 tablet 0  . ondansetron (ZOFRAN) 4 MG tablet Take 1  tablet (4 mg total) by mouth every 6 (six) hours. 12 tablet 0  . pantoprazole (PROTONIX) 40 MG tablet Take 1 tablet (40 mg total) by mouth daily. 30 tablet 5  . pravastatin (PRAVACHOL) 80 MG tablet TAKE 1 TABLET BY MOUTH EVERY DAY 90 tablet 1  . tamsulosin (FLOMAX) 0.4 MG CAPS capsule TAKE 1 CAPSULE BY MOUTH EVERY DAY 90 capsule 1  . traMADol (ULTRAM) 50 MG tablet Take 1 tablet (50 mg total) by mouth every 12 (twelve) hours as needed. 10 tablet 0   Social History   Socioeconomic History  . Marital status: Married    Spouse name: Not on file  . Number of children: Not on file  . Years of education: Not on file  . Highest education level: Not on file  Occupational History  . Not on file  Tobacco Use  . Smoking status: Never Smoker  . Smokeless tobacco: Never Used  Substance and Sexual Activity  . Alcohol use: No  . Drug use: No  . Sexual activity: Not on file  Other Topics Concern  . Not on file  Social History Narrative  . Not on file   Social Determinants of Health   Financial Resource Strain:   . Difficulty of Paying Living Expenses:   Food Insecurity:   . Worried About Programme researcher, broadcasting/film/video in the Last Year:   . [employer] of Food in the Last Year:  Transportation Needs:   . Freight forwarder (Medical):   Marland Kitchen Lack of Transportation (Non-Medical):   Physical Activity:   . Days of Exercise per Week:   . Minutes of Exercise per Session:   Stress:   . Feeling of Stress :   Social Connections:   . Frequency of Communication with Friends and Family:   . Frequency of Social Gatherings with Friends and Family:   . Attends Religious Services:   . Active Member of Clubs or Organizations:   . Attends Banker Meetings:   Marland Kitchen Marital Status:   Intimate Partner Violence:   . Fear of Current or Ex-Partner:   . Emotionally Abused:   Marland Kitchen Physically Abused:   . Sexually Abused:    Family History  Problem Relation Age of Onset  . Heart attack Father       OBJECTIVE:  Vitals:   03/23/20 0818  BP: (!) 162/88  Pulse: 61  Temp: 98.2 F (36.8 C)  SpO2: 97%    General appearance: Alert; NAD HEENT: NCAT.  Oropharynx clear.  Lungs: clear to auscultation bilaterally without adventitious breath sounds Heart: regular rate and rhythm.  Radial pulses 2+ symmetrical bilaterally Abdomen: soft, mildy distended; normal active bowel sounds; tender to light and deep palpation; nontender at McBurney's point; negative Murphy's sign; negative rebound; no guarding Back: no CVA tenderness Extremities: no edema; symmetrical with no gross deformities Skin: warm and dry Neurologic: normal gait Psychological: alert and cooperative; normal mood and affect  LABS: Results for orders placed or performed during the hospital encounter of 03/23/20 (from the past 24 hour(s))  POCT urinalysis dipstick     Status: Abnormal   Collection Time: 03/23/20  8:30 AM  Result Value Ref Range   Color, UA yellow yellow   Clarity, UA clear clear   Glucose, UA >=1,000 (A) negative mg/dL   Bilirubin, UA negative negative   Ketones, POC UA negative negative mg/dL   Spec Grav, UA 4.742 5.956 - 1.025   Blood, UA large (A) negative   pH, UA 5.0 5.0 - 8.0   Protein Ur, POC trace (A) negative mg/dL   Urobilinogen, UA 0.2 0.2 or 1.0 E.U./dL   Nitrite, UA Negative Negative   Leukocytes, UA Negative Negative  POCT CBG (manual entry)     Status: Abnormal   Collection Time: 03/23/20  8:32 AM  Result Value Ref Range   POCT Glucose (KUC) 295 (A) 70 - 99 mg/dL    DIAGNOSTIC STUDIES: No results found.   ASSESSMENT & PLAN:  1. Other constipation   2. Generalized abdominal pain     Meds ordered this encounter  Medications  . polyethylene glycol powder (MIRALAX) 17 GM/SCOOP powder    Sig: Take 1 scoop (17 Gm) by mouth daily for constipation.  May mix with water, juice or milk.    Dispense:  255 g    Refill:  3    Discharge Instructions  Recommend increasing water  intake.  Drink at least half your body weight in ounces Increased fiber rich foods in diet (see hand-out) Miralax prescribed.  Take as directed and to completion Take warm prune juice Follow up with PCP if symptoms persists Return or go to the ED if you have any new or worsening symptoms such as increased abdominal pain, nausea, vomiting, if you go 3-4 days without bowel movement, chest pain, shortness of breath, abdomen feels hard or distended, etc...  Reviewed expectations re: course of current medical issues. Questions answered. Outlined  signs and symptoms indicating need for more acute intervention. Patient verbalized understanding. After Visit Summary given.    Note: This document was prepared using Dragon voice recognition software and may include unintentional dictation errors.    Durward Parcel, FNP 03/23/20 [phone]

## 2020-03-23 NOTE — ED Triage Notes (Signed)
Pt began having abdominal pain that began last night has h/o kidney stones and also reports some constipation

## 2020-03-23 NOTE — Discharge Instructions (Addendum)
Recommend increasing water intake.  Drink at least half your body weight in ounces Increased fiber rich foods in diet (see hand-out) Miralax prescribed.  Take as directed and to completion Take warm prune juice Follow up with PCP if symptoms persists Return or go to the ED if you have any new or worsening symptoms such as increased abdominal pain, nausea, vomiting, if you go 3-4 days without bowel movement, chest pain, shortness of breath, abdomen feels hard or distended, etc..Marland Kitchen

## 2020-03-24 ENCOUNTER — Other Ambulatory Visit: Payer: Self-pay | Admitting: Family Medicine

## 2020-03-24 LAB — URINE CULTURE: Culture: <10000 — AB

## 2020-03-26 ENCOUNTER — Encounter: Payer: Self-pay | Admitting: Family Medicine

## 2020-03-26 ENCOUNTER — Other Ambulatory Visit (HOSPITAL_COMMUNITY)
Admission: RE | Admit: 2020-03-26 | Discharge: 2020-03-26 | Disposition: A | Discharge status: Home / Self Care | Payer: 59 | Source: Ambulatory Visit | Attending: Family Medicine | Admitting: Family Medicine

## 2020-03-26 ENCOUNTER — Telehealth: Payer: Self-pay | Admitting: Family Medicine

## 2020-03-26 ENCOUNTER — Other Ambulatory Visit: Payer: Self-pay

## 2020-03-26 ENCOUNTER — Ambulatory Visit (INDEPENDENT_AMBULATORY_CARE_PROVIDER_SITE_OTHER): Payer: 59 | Admitting: Family Medicine

## 2020-03-26 VITALS — BP 134/82 | HR 97 | Temp 97.2°F | Ht 67.0 in | Wt 175.0 lb

## 2020-03-26 DIAGNOSIS — R109 Unspecified abdominal pain: Secondary | ICD-10-CM | POA: Diagnosis present

## 2020-03-26 DIAGNOSIS — R0600 Dyspnea, unspecified: Secondary | ICD-10-CM

## 2020-03-26 DIAGNOSIS — R1084 Generalized abdominal pain: Secondary | ICD-10-CM | POA: Insufficient documentation

## 2020-03-26 DIAGNOSIS — R0609 Other forms of dyspnea: Secondary | ICD-10-CM | POA: Insufficient documentation

## 2020-03-26 LAB — CBC WITH DIFFERENTIAL/PLATELET
Abs Immature Granulocytes: 0.03 10*3/uL (ref 0.00–0.07)
Basophils Absolute: 0.1 10*3/uL (ref 0.0–0.1)
Basophils Relative: 1 %
Eosinophils Absolute: 0.4 10*3/uL (ref 0.0–0.5)
Eosinophils Relative: 5 %
HCT: 46.5 % (ref 39.0–52.0)
Hemoglobin: 14.6 g/dL (ref 13.0–17.0)
Immature Granulocytes: 0 %
Lymphocytes Relative: 31 %
Lymphs Abs: 2.3 10*3/uL (ref 0.7–4.0)
MCH: 26.1 pg (ref 26.0–34.0)
MCHC: 31.4 g/dL (ref 30.0–36.0)
MCV: 83.2 fL (ref 80.0–100.0)
Monocytes Absolute: 0.7 10*3/uL (ref 0.1–1.0)
Monocytes Relative: 9 %
Neutro Abs: 4 10*3/uL (ref 1.7–7.7)
Neutrophils Relative %: 54 %
Platelets: 247 10*3/uL (ref 150–400)
RBC: 5.59 MIL/uL (ref 4.22–5.81)
RDW: 13.2 % (ref 11.5–15.5)
WBC: 7.4 10*3/uL (ref 4.0–10.5)
nRBC: 0 % (ref 0.0–0.2)

## 2020-03-26 LAB — COMPREHENSIVE METABOLIC PANEL
ALT: 35 U/L (ref 0–44)
AST: 37 U/L (ref 15–41)
Albumin: 4.7 g/dL (ref 3.5–5.0)
Alkaline Phosphatase: 80 U/L (ref 38–126)
Anion gap: 12 (ref 5–15)
BUN: 27 mg/dL — ABNORMAL HIGH (ref 8–23)
CO2: 24 mmol/L (ref 22–32)
Calcium: 10.1 mg/dL (ref 8.9–10.3)
Chloride: 101 mmol/L (ref 98–111)
Creatinine, Ser: 1.53 mg/dL — ABNORMAL HIGH (ref 0.61–1.24)
GFR calc Af Amer: 56 mL/min — ABNORMAL LOW (ref >60)
GFR calc non Af Amer: 48 mL/min — ABNORMAL LOW (ref >60)
Glucose, Bld: 84 mg/dL (ref 70–99)
Potassium: 4.1 mmol/L (ref 3.5–5.1)
Sodium: 137 mmol/L (ref 135–145)
Total Bilirubin: 0.6 mg/dL (ref 0.3–1.2)
Total Protein: 8.8 g/dL — ABNORMAL HIGH (ref 6.5–8.1)

## 2020-03-26 LAB — AMYLASE: Amylase: 43 U/L (ref 28–100)

## 2020-03-26 LAB — LIPASE, BLOOD: Lipase: 48 U/L (ref 11–51)

## 2020-03-26 NOTE — Progress Notes (Addendum)
Patient ID: Rodney Meyers, male    DOB: 1956/04/08, 64 y.o.   MRN: [id number]   Chief Complaint  Patient presents with  . Follow-up   Subjective:    HPI Pt went to Urgent Care on 03/23/20 for abdominal pain. Pt states he had dark stools this morning (started after urgent care), feeling of having to defecate again after just using bathroom, loss of appetite and short of breath when walking fast. Generalized abdominal pain. Wife in room and assisting with history of recent events.   Medical History Jamarien has a past medical history of Asthma and Diabetes mellitus without complication (HCC).   Outpatient Encounter Medications as of 03/26/2020  Medication Sig  . albuterol (VENTOLIN HFA) 108 (90 Base) MCG/ACT inhaler 2 PUFFS 4 TIMES A DAY AS NEEDED FOR WHEEZING  . diclofenac (VOLTAREN) 75 MG EC tablet Take 1 tablet (75 mg total) by mouth 2 (two) times daily as needed. With food  . FLOVENT HFA 44 MCG/ACT inhaler TAKE 2 PUFFS BY MOUTH TWICE A DAY  . glipiZIDE (GLUCOTROL) 5 MG tablet TAKE 1 TABLET BY MOUTH IN THE MORNING AND HALF A TABLET BY MOUTH IN THE EVENING  . ibuprofen (ADVIL) 800 MG tablet Take 1 tablet (800 mg total) by mouth 3 (three) times daily.  . ondansetron (ZOFRAN) 4 MG tablet Take 1 tablet (4 mg total) by mouth every 6 (six) hours.  . pantoprazole (PROTONIX) 40 MG tablet Take 1 tablet (40 mg total) by mouth daily.  . pravastatin (PRAVACHOL) 80 MG tablet TAKE 1 TABLET BY MOUTH EVERY DAY  . tamsulosin (FLOMAX) 0.4 MG CAPS capsule TAKE 1 CAPSULE BY MOUTH EVERY DAY  . traMADol (ULTRAM) 50 MG tablet Take 1 tablet (50 mg total) by mouth every 12 (twelve) hours as needed.  . polyethylene glycol powder (MIRALAX) 17 GM/SCOOP powder Take 1 scoop (17 Gm) by mouth daily for constipation.  May mix with water, juice or milk. (Patient not taking: Reported on 03/26/2020)   No facility-administered encounter medications on file as of 03/26/2020.     Review of Systems  Constitutional:  Positive for fatigue. Negative for fever.  HENT: Negative.   Eyes: Negative.   Respiratory: Positive for shortness of breath.        With exertion.  Gastrointestinal: Positive for abdominal pain and constipation.       Ongoing for a couple of weeks. Thought to be kidney stones.(is not aware of passing a stone). Stool was dark this morning (has taken Pepto in last 3 days).  Endocrine: Negative.   Genitourinary: Negative for hematuria.  Musculoskeletal: Negative.   Skin: Negative.      Vitals BP 134/82   Pulse 97   Temp (!) 97.2 F (36.2 C)   Ht 5\' 7"  (1.702 m)   Wt 175 lb (79.4 kg)   SpO2 98%   BMI 27.41 kg/m   Objective:   Physical Exam Constitutional:      Appearance: Normal appearance.  Cardiovascular:     Rate and Rhythm: Normal rate and regular rhythm.     Heart sounds: Normal heart sounds.  Pulmonary:     Effort: Pulmonary effort is normal.     Breath sounds: Normal breath sounds.  Abdominal:     Tenderness: There is guarding.     Comments: Pain in LUQ, LLQ, epigastric, RLQ with hypoactive bowel sounds.  Skin:    General: Skin is warm and dry.  Neurological:     Mental Status: He is alert  and oriented to person, place, and time.  Psychiatric:        Behavior: Behavior normal.      Assessment and Plan   1. Generalized abdominal pain - CT Abdomen Pelvis W Contrast - Comprehensive Metabolic Panel (CMET) - CBC with Differential - Lipase - Amylase  2. DOE (dyspnea on exertion) - EKG 12-Lead - Ambulatory referral to Cardiology  3. Abdominal pain, unspecified abdominal location - Comprehensive Metabolic Panel (CMET) - CBC with Differential - Lipase - Amylase   Mr. Oblinger presents today after going to ED twice this month already. Thought to have kidney stone, however, he has had kidney stone in the past and this does not feel the same. Denies blood in urine and denies seeing a stone pass. EKG done in office: regular rate without ST elevation.  Abdominal exam concerning for acute findings. Generalized pain (LUQ, LLQ, epigastric, and RLQ). No vomiting, no chest pain. Has a history of diverticulosis noted on CT scan done 01/15/19.  Plan: Stat labs ordered. Needs Stat CT scan done today to rule out acute findings (diverticulitis, appendix, gall bladder, pancreatitis, etc). Patient does not monitor his blood sugar, therefore, it is possible that his diabetes is not well-controlled now which could lead to additional problems down the road. Will address this on next visit.  We will pursue testing today and schedule follow-up for annual exam with Dr. Ladona Ridgel soon.  Will notify patient of results of findings today once become available and discuss next steps based on findings.  Understands and agrees with plan of care discussed today.  Cardiology referral ordered for shortness of breath on exertion.  1500: Update---  Unable to get stat ct scan approved by insurance today. Kenney Houseman, LPN called patient to inform him of this. Instructed that if his pain increases, he develops a fever or any other concerning symptoms he needs to immediately go to the ED. Wife states she will call insurance company to inquire why there is a delay in approval. Understands and agrees with instructions.   Novella Olive, NP 03/26/2020

## 2020-03-26 NOTE — Telephone Encounter (Signed)
Per Karen/Dr.Scott, call CT and relay lab results to CT personal. BUN 27;  CREAT 1.53; GFR 48. CT personal states GFR is OK and they can give normal dose.

## 2020-03-27 ENCOUNTER — Ambulatory Visit (HOSPITAL_COMMUNITY)
Admission: RE | Admit: 2020-03-27 | Discharge: 2020-03-27 | Disposition: A | Discharge status: Home / Self Care | Payer: 59 | Source: Ambulatory Visit | Attending: Family Medicine | Admitting: Family Medicine

## 2020-03-27 ENCOUNTER — Encounter (HOSPITAL_COMMUNITY): Payer: Self-pay

## 2020-03-27 ENCOUNTER — Telehealth: Payer: Self-pay | Admitting: Family Medicine

## 2020-03-27 ENCOUNTER — Other Ambulatory Visit: Payer: Self-pay | Admitting: *Deleted

## 2020-03-27 DIAGNOSIS — R1084 Generalized abdominal pain: Secondary | ICD-10-CM | POA: Insufficient documentation

## 2020-03-27 DIAGNOSIS — N289 Disorder of kidney and ureter, unspecified: Secondary | ICD-10-CM

## 2020-03-27 MED ORDER — IOHEXOL 300 MG/ML  SOLN
100.0000 mL | Freq: Once | INTRAMUSCULAR | Status: AC | PRN
  Administered 2020-03-27: 100 mL via INTRAVENOUS

## 2020-03-27 MED ORDER — IOHEXOL 9 MG/ML PO SOLN
ORAL | Status: AC
  Filled 2020-03-27: qty 1000

## 2020-03-27 NOTE — Telephone Encounter (Signed)
Called to give info for CT scan for this morning & pt's wife asked if we had his lab resutlts  Please advise & call pt

## 2020-03-27 NOTE — Telephone Encounter (Signed)
Please advise. Thank you

## 2020-03-27 NOTE — Telephone Encounter (Signed)
Patient is requesting results of labs 

## 2020-03-27 NOTE — Telephone Encounter (Signed)
Rodney Meyers,   I called patients wife.   Rodney Meyers

## 2020-03-30 ENCOUNTER — Encounter: Payer: Self-pay | Admitting: Family Medicine

## 2020-04-01 ENCOUNTER — Encounter: Payer: Self-pay | Admitting: Family Medicine

## 2020-04-15 ENCOUNTER — Other Ambulatory Visit: Payer: Self-pay | Admitting: Family Medicine

## 2020-04-21 ENCOUNTER — Ambulatory Visit: Payer: PRIVATE HEALTH INSURANCE | Admitting: Family Medicine

## 2020-05-16 ENCOUNTER — Other Ambulatory Visit: Payer: Self-pay | Admitting: Family Medicine

## 2020-05-25 ENCOUNTER — Ambulatory Visit: Payer: 59 | Admitting: Cardiology

## 2020-06-01 ENCOUNTER — Other Ambulatory Visit: Payer: Self-pay | Admitting: Family Medicine

## 2020-06-08 ENCOUNTER — Telehealth: Payer: Self-pay | Admitting: Family Medicine

## 2020-06-08 NOTE — Telephone Encounter (Signed)
[facility] [place] requesting refill for Tamsulosin 0.4 mg capsule. Take one capsule po every day. Pt last seen 03/26/20 for abdominal pain. Please advise. Thank you

## 2020-06-08 NOTE — Telephone Encounter (Signed)
Pt needs lipids, when can he come get fasting labs?  Dr. Ladona Ridgel

## 2020-06-08 NOTE — Telephone Encounter (Signed)
Also requesting refill on Pravastatin 80 mg tablet. Take one tablet po every day. Please advise. Thank you

## 2020-06-09 MED ORDER — PRAVASTATIN SODIUM 80 MG PO TABS: 80.0000 mg | ORAL_TABLET | Freq: Every day | ORAL | 0 refills | Status: DC

## 2020-06-09 NOTE — Addendum Note (Signed)
Addended by: Annalee Genta on: 06/09/2020 11:53 AM   Modules accepted: Orders

## 2020-06-09 NOTE — Telephone Encounter (Signed)
Patient sent my chart message notifying him to complete labs as advised

## 2020-06-16 ENCOUNTER — Ambulatory Visit (INDEPENDENT_AMBULATORY_CARE_PROVIDER_SITE_OTHER): Payer: 59 | Admitting: Family Medicine

## 2020-06-16 ENCOUNTER — Encounter: Payer: Self-pay | Admitting: Family Medicine

## 2020-06-16 VITALS — BP 116/74 | HR 72 | Temp 98.2°F | Wt 172.8 lb

## 2020-06-16 DIAGNOSIS — E119 Type 2 diabetes mellitus without complications: Secondary | ICD-10-CM | POA: Diagnosis not present

## 2020-06-16 DIAGNOSIS — E785 Hyperlipidemia, unspecified: Secondary | ICD-10-CM | POA: Diagnosis not present

## 2020-06-16 DIAGNOSIS — Z23 Encounter for immunization: Secondary | ICD-10-CM | POA: Diagnosis not present

## 2020-06-16 DIAGNOSIS — K219 Gastro-esophageal reflux disease without esophagitis: Secondary | ICD-10-CM

## 2020-06-16 DIAGNOSIS — L918 Other hypertrophic disorders of the skin: Secondary | ICD-10-CM | POA: Diagnosis not present

## 2020-06-16 LAB — POCT GLYCOSYLATED HEMOGLOBIN (HGB A1C): Hemoglobin A1C: 6.5 % — AB (ref 4.0–5.6)

## 2020-06-16 MED ORDER — GLIPIZIDE 5 MG PO TABS
ORAL_TABLET | ORAL | 1 refills | Status: DC
Start: 2020-06-16 — End: 2021-01-28

## 2020-06-16 MED ORDER — PANTOPRAZOLE SODIUM 40 MG PO TBEC: 40.0000 mg | DELAYED_RELEASE_TABLET | Freq: Every day | ORAL | 5 refills | Status: DC

## 2020-06-16 NOTE — Progress Notes (Signed)
Patient ID: Rodney Meyers, male    DOB: Aug 02, 1956, 64 y.o.   MRN: [id number]   Chief Complaint  Patient presents with  . Hyperlipidemia  . Diabetes  . Gastroesophageal Reflux    Patient complains of indigestion x 1 month. Has tried tums, prevacid and zantac. States he was not aware of protonix rx. Patient also reports biting his lip 4 days ago and it is very painful and doesn't seem to be getting better.    Subjective:    HPI  F/u hld, dm, and gerd.  Seen kidney doctor in past and not needing anything at this time.  Has some skin tags on back of right neck, and had them removed before.  Came back on rt side.  DM2- taking glipizide and doing well. Taking 1 tab glipizide 5mg  and then 1/2 in evening. But see low last week, stopped meds for a week.  HLD- doing well no new concerns.  Compliant with meds. No chest pain, palpitations, myalgias or joint pains.  GERD- taking tums and prevacid.  Had script called in for protonix, pt never picked it up.  Medical History Montie has a past medical history of Asthma and Diabetes mellitus without complication (HCC).   Outpatient Encounter Medications as of 06/16/2020  Medication Sig  . albuterol (VENTOLIN HFA) 108 (90 Base) MCG/ACT inhaler 2 PUFFS 4 TIMES A DAY AS NEEDED FOR WHEEZING  . diclofenac (VOLTAREN) 75 MG EC tablet Take 1 tablet (75 mg total) by mouth 2 (two) times daily as needed. With food  . FLOVENT HFA 44 MCG/ACT inhaler TAKE 2 PUFFS BY MOUTH TWICE A DAY  . glipiZIDE (GLUCOTROL) 5 MG tablet Take 1/2 tab p.o. bid with meals.  13/09/2019 ibuprofen (ADVIL) 800 MG tablet Take 1 tablet (800 mg total) by mouth 3 (three) times daily.  . pantoprazole (PROTONIX) 40 MG tablet Take 1 tablet (40 mg total) by mouth daily.  . polyethylene glycol powder (MIRALAX) 17 GM/SCOOP powder Take 1 scoop (17 Gm) by mouth daily for constipation.  May mix with water, juice or milk. (Patient not taking: Reported on 03/26/2020)  . [DISCONTINUED] glipiZIDE  (GLUCOTROL) 5 MG tablet TAKE 1 TABLET BY MOUTH IN THE MORNING AND HALF A TABLET BY MOUTH IN THE EVENING  . [DISCONTINUED] ondansetron (ZOFRAN) 4 MG tablet Take 1 tablet (4 mg total) by mouth every 6 (six) hours.  . [DISCONTINUED] pantoprazole (PROTONIX) 40 MG tablet Take 1 tablet (40 mg total) by mouth daily. (Patient not taking: Reported on 06/16/2020)  . [DISCONTINUED] pravastatin (PRAVACHOL) 80 MG tablet Take 1 tablet (80 mg total) by mouth daily. NEEDS LABS.  . [DISCONTINUED] tamsulosin (FLOMAX) 0.4 MG CAPS capsule TAKE 1 CAPSULE BY MOUTH EVERY DAY (Patient not taking: Reported on 06/16/2020)  . [DISCONTINUED] traMADol (ULTRAM) 50 MG tablet Take 1 tablet (50 mg total) by mouth every 12 (twelve) hours as needed. (Patient not taking: Reported on 06/16/2020)   No facility-administered encounter medications on file as of 06/16/2020.     Review of Systems  Constitutional: Negative for chills and fever.  HENT: Positive for mouth sores (bite to side of mouth, healing). Negative for congestion, rhinorrhea and sore throat.   Respiratory: Negative for cough, shortness of breath and wheezing.   Cardiovascular: Negative for chest pain and leg swelling.  Gastrointestinal: Negative for abdominal pain, diarrhea, nausea and vomiting.       +indigestion  Genitourinary: Negative for dysuria and frequency.  Skin: Negative for rash.       +  skin tags on neck  Neurological: Negative for dizziness, weakness and headaches.     Vitals BP 116/74   Pulse 72   Temp 98.2 F (36.8 C)   Wt 172 lb 12.8 oz (78.4 kg)   SpO2 98%   BMI 27.06 kg/m   Objective:   Physical Exam Vitals and nursing note reviewed.  Constitutional:      General: He is not in acute distress.    Appearance: Normal appearance. He is not ill-appearing.  HENT:     Head: Normocephalic.     Nose: Nose normal. No congestion.     Mouth/Throat:     Mouth: Mucous membranes are moist.     Pharynx: No oropharyngeal exudate.  Eyes:      Extraocular Movements: Extraocular movements intact.     Conjunctiva/sclera: Conjunctivae normal.     Pupils: Pupils are equal, round, and reactive to light.  Cardiovascular:     Rate and Rhythm: Normal rate and regular rhythm.     Pulses: Normal pulses.     Heart sounds: Normal heart sounds. No murmur heard.   Pulmonary:     Effort: Pulmonary effort is normal.     Breath sounds: Normal breath sounds. No wheezing, rhonchi or rales.  Musculoskeletal:        General: Normal range of motion.     Right lower leg: No edema.     Left lower leg: No edema.  Skin:    General: Skin is warm and dry.     Findings: No rash.  Neurological:     General: No focal deficit present.     Mental Status: He is alert and oriented to person, place, and time.     Cranial Nerves: No cranial nerve deficit.  Psychiatric:        Mood and Affect: Mood normal.        Behavior: Behavior normal.        Thought Content: Thought content normal.        Judgment: Judgment normal.      Assessment and Plan   1. Type 2 diabetes mellitus without complication, without long-term current use of insulin (HCC) - POCT HgB A1C - Microalbumin, urine - Lipid panel  2. Need for vaccination - Flu Vaccine QUAD 6+ mos PF IM (Fluarix Quad PF)  3. Skin tag  4. Gastroesophageal reflux disease, unspecified whether esophagitis present  5. Hyperlipidemia LDL goal <100   Insomnia- rec melatonin for sleep  DM2- advising pt to watch his bg And dec the glipizide 2.5mg  bid instead of 5mg  am and 2.5mg  pm due to having low bg occasionally during the day. -Pt to get urine microalbumin and lipid panel.  After labs will reorder refill for pravastatin.  hld- needs labs.  Last lipid in 2019.  Cont meds.  Skin tags- on rt neck, recommending f/u derm.  gerd- pt to take protonix, if not improving over next 3-4 wks to call or rto.  F/u 65mo or prn.

## 2020-06-16 NOTE — Patient Instructions (Signed)
Pantoprazole- for stomach heartburn.  Take it daily before eating.

## 2020-06-17 LAB — LIPID PANEL
Chol/HDL Ratio: 6.9 ratio — ABNORMAL HIGH (ref 0.0–5.0)
Cholesterol, Total: 236 mg/dL — ABNORMAL HIGH (ref 100–199)
HDL: 34 mg/dL — ABNORMAL LOW (ref >39)
LDL Chol Calc (NIH): 163 mg/dL — ABNORMAL HIGH (ref 0–99)
Triglycerides: 212 mg/dL — ABNORMAL HIGH (ref 0–149)
VLDL Cholesterol Cal: 39 mg/dL (ref 5–40)

## 2020-06-17 LAB — MICROALBUMIN, URINE: Microalbumin, Urine: 13.6 ug/mL

## 2020-06-17 MED ORDER — PRAVASTATIN SODIUM 80 MG PO TABS
80.0000 mg | ORAL_TABLET | Freq: Every evening | ORAL | 1 refills | Status: DC
Start: 2020-06-17 — End: 2022-05-23

## 2020-06-17 NOTE — Telephone Encounter (Signed)
Pt had labs completed yesterday. Please advise. Thank you

## 2020-06-17 NOTE — Addendum Note (Signed)
Addended by: Annalee Genta on: 06/17/2020 05:13 PM   Modules accepted: Orders

## 2020-06-21 ENCOUNTER — Encounter: Payer: Self-pay | Admitting: Family Medicine

## 2020-06-26 ENCOUNTER — Encounter: Payer: Self-pay | Admitting: Emergency Medicine

## 2020-06-26 ENCOUNTER — Ambulatory Visit
Admission: EM | Admit: 2020-06-26 | Discharge: 2020-06-26 | Disposition: A | Discharge status: Home / Self Care | Payer: 59 | Attending: Emergency Medicine | Admitting: Emergency Medicine

## 2020-06-26 DIAGNOSIS — M549 Dorsalgia, unspecified: Secondary | ICD-10-CM | POA: Diagnosis not present

## 2020-06-26 MED ORDER — CYCLOBENZAPRINE HCL 10 MG PO TABS: 10.0000 mg | ORAL_TABLET | Freq: Every day | ORAL | 0 refills | Status: DC

## 2020-06-26 MED ORDER — PREDNISONE 10 MG (21) PO TBPK: ORAL_TABLET | Freq: Every day | ORAL | 0 refills | Status: DC

## 2020-06-26 MED ORDER — DEXAMETHASONE SODIUM PHOSPHATE 10 MG/ML IJ SOLN
10.0000 mg | Freq: Once | INTRAMUSCULAR | Status: AC
  Administered 2020-06-26: 10 mg via INTRAMUSCULAR

## 2020-06-26 NOTE — Discharge Instructions (Signed)
Steroid shot given in office Continue conservative management of rest, ice, and gentle stretches Prednisone prescribed.  Take as directed and to completion Take cyclobenzaprine at nighttime for symptomatic relief. Avoid driving or operating heavy machinery while using medication. Follow up with PCP if symptoms persist Return or go to the ER if you have any new or worsening symptoms (fever, chills, chest pain, abdominal pain, changes in bowel or bladder habits, pain radiating into lower legs, etc...)  

## 2020-06-26 NOTE — ED Triage Notes (Signed)
Pain in LT flank area x 1 week. Pt was lifting heavy boxes at work.  Denies any urinary s/s

## 2020-06-26 NOTE — ED Provider Notes (Signed)
[facility]ENTER   [id number] 06/26/20 Arrival Time: 1007  CC: Back PAIN  SUBJECTIVE: History from: patient. Maximilliano Kersh Wittmann is a 64 y.o. male complains of LT low back pain x 1 week.  Symptoms began after lifting heavy boxes.  Localizes the pain to the LT mid to low back.  Describes the pain as constant and sharp in character.  Has tried OTC medications with minimal relief.  Symptoms are made worse with movement.  Denies fever, chills, erythema, ecchymosis, effusion, weakness, numbness and tingling, saddle paresthesias, loss of bowel or bladder function.      ROS: As per HPI.  All other pertinent ROS negative.     Past Medical History:  Diagnosis Date  . Asthma   . Diabetes mellitus without complication [facility])    Past Surgical History:  Procedure Laterality Date  . L. arm surgery     No Known Allergies No current facility-administered medications on file prior to encounter.   Current Outpatient Medications on File Prior to Encounter  Medication Sig Dispense Refill  . albuterol (VENTOLIN HFA) 108 (90 Base) MCG/ACT inhaler 2 PUFFS 4 TIMES A DAY AS NEEDED FOR WHEEZING 18 g 3  . diclofenac (VOLTAREN) 75 MG EC tablet Take 1 tablet (75 mg total) by mouth 2 (two) times daily as needed. With food 28 tablet 1  . FLOVENT HFA 44 MCG/ACT inhaler TAKE 2 PUFFS BY MOUTH TWICE A DAY 10.6 Inhaler 2  . glipiZIDE (GLUCOTROL) 5 MG tablet Take 1/2 tab p.o. bid with meals. 90 tablet 1  . ibuprofen (ADVIL) 800 MG tablet Take 1 tablet (800 mg total) by mouth 3 (three) times daily. 21 tablet 0  . pantoprazole (PROTONIX) 40 MG tablet Take 1 tablet (40 mg total) by mouth daily. 30 tablet 5  . polyethylene glycol powder (MIRALAX) 17 GM/SCOOP powder Take 1 scoop (17 Gm) by mouth daily for constipation.  May mix with water, juice or milk. (Patient not taking: Reported on 03/26/2020) 255 g 3  . pravastatin (PRAVACHOL) 80 MG tablet Take 1 tablet (80 mg total) by mouth at bedtime. 90 tablet 1   Social  History   Socioeconomic History  . Marital status: Married    Spouse name: Not on file  . Number of children: Not on file  . Years of education: Not on file  . Highest education level: Not on file  Occupational History  . Not on file  Tobacco Use  . Smoking status: Never Smoker  . Smokeless tobacco: Never Used  Substance and Sexual Activity  . Alcohol use: No  . Drug use: No  . Sexual activity: Not on file  Other Topics Concern  . Not on file  Social History Narrative  . Not on file   Social Determinants of Health   Financial Resource Strain:   . Difficulty of Paying Living Expenses: Not on file  Food Insecurity:   . Worried About Programme researcher, broadcasting/film/video in the Last Year: Not on file  . Ran Out of Food in the Last Year: Not on file  Transportation Needs:   . Lack of Transportation (Medical): Not on file  . Lack of Transportation (Non-Medical): Not on file  Physical Activity:   . Days of Exercise per Week: Not on file  . Minutes of Exercise per Session: Not on file  Stress:   . Feeling of Stress : Not on file  Social Connections:   . Frequency of Communication with Friends and Family: Not on file  .  Frequency of Social Gatherings with Friends and Family: Not on file  . Attends Religious Services: Not on file  . Active Member of Clubs or Organizations: Not on file  . Attends Banker Meetings: Not on file  . Marital Status: Not on file  Intimate Partner Violence:   . Fear of Current or Ex-Partner: Not on file  . Emotionally Abused: Not on file  . Physically Abused: Not on file  . Sexually Abused: Not on file   Family History  Problem Relation Age of Onset  . Heart attack Father     OBJECTIVE:  Vitals:   06/26/20 1034  BP: 129/67  Pulse: (!) 55  Resp: 17  Temp: 98 F (36.7 C)  TempSrc: Oral  SpO2: 97%    General appearance: ALERT; in no acute distress.  Head: NCAT Lungs: Normal respiratory effort Musculoskeletal: Back  Inspection: Skin warm,  dry, clear and intact without obvious erythema, effusion, or ecchymosis.  Palpation: Diffusely TTP over LT mid to low back ROM: FROM active and passive Strength: 5/5 shld abduction, 5/5 shld adduction, 5/5 elbow flexion, 5/5 elbow extension, 5/5 grip strength, 5/5 knee flexion, 5/5 knee extension Skin: warm and dry Neurologic: Ambulates without difficulty Psychological: alert and cooperative; normal mood and affect  ASSESSMENT & PLAN:  1. Mid back pain on left side    Meds ordered this encounter  Medications  . predniSONE (STERAPRED UNI-PAK 21 TAB) 10 MG (21) TBPK tablet    Sig: Take by mouth daily. Take 6 tabs by mouth daily  for 2 days, then 5 tabs for 2 days, then 4 tabs for 2 days, then 3 tabs for 2 days, 2 tabs for 2 days, then 1 tab by mouth daily for 2 days    Dispense:  42 tablet    Refill:  0    Order Specific Question:   Supervising Provider    Answer:   Eustace Moore [[id number]]  . cyclobenzaprine (FLEXERIL) 10 MG tablet    Sig: Take 1 tablet (10 mg total) by mouth at bedtime.    Dispense:  15 tablet    Refill:  0    Order Specific Question:   Supervising Provider    Answer:   Eustace Moore [[id number]]  . dexamethasone (DECADRON) injection 10 mg   Steroid shot given in office Continue conservative management of rest, ice, and gentle stretches Prednisone prescribed.  Take as directed and to completion Take cyclobenzaprine at nighttime for symptomatic relief. Avoid driving or operating heavy machinery while using medication. Follow up with PCP if symptoms persist Return or go to the ER if you have any new or worsening symptoms (fever, chills, chest pain, abdominal pain, changes in bowel or bladder habits, pain radiating into lower legs, etc...)   Reviewed expectations re: course of current medical issues. Questions answered. Outlined signs and symptoms indicating need for more acute intervention. Patient verbalized understanding. After Visit Summary given.      [name], PA-C 06/26/20 1057

## 2020-07-11 ENCOUNTER — Ambulatory Visit
Admission: EM | Admit: 2020-07-11 | Discharge: 2020-07-11 | Disposition: A | Discharge status: Home / Self Care | Payer: 59 | Attending: Family Medicine | Admitting: Family Medicine

## 2020-07-11 ENCOUNTER — Other Ambulatory Visit: Payer: Self-pay

## 2020-07-11 ENCOUNTER — Encounter: Payer: Self-pay | Admitting: Emergency Medicine

## 2020-07-11 DIAGNOSIS — R52 Pain, unspecified: Secondary | ICD-10-CM

## 2020-07-11 DIAGNOSIS — J988 Other specified respiratory disorders: Secondary | ICD-10-CM

## 2020-07-11 MED ORDER — BENZONATATE 100 MG PO CAPS
100.0000 mg | ORAL_CAPSULE | Freq: Three times a day (TID) | ORAL | 0 refills | Status: DC | PRN
Start: 2020-07-11 — End: 2021-09-09

## 2020-07-11 MED ORDER — IPRATROPIUM BROMIDE 0.03 % NA SOLN: 2.0000 | Freq: Two times a day (BID) | NASAL | 0 refills | Status: DC | PRN

## 2020-07-11 MED ORDER — LEVOCETIRIZINE DIHYDROCHLORIDE 5 MG PO TABS: 5.0000 mg | ORAL_TABLET | Freq: Every evening | ORAL | 0 refills | Status: DC

## 2020-07-11 NOTE — ED Provider Notes (Signed)
[facility]    CSN: [id number] Arrival date & time: 07/11/20  1232      History   Chief Complaint Chief Complaint  Patient presents with  . Nasal Congestion    HPI Rodney Meyers is a 64 y.o. male.   HPI  Patient presents with one week  of nasal congestion, mild cough, and nasal drainage. Patient endorses symptoms are mild however are lingering. He has taken OTC medication without improvement of symptoms. He denies any difficulty breathing. He is unaware of any sick contacts. He also endorses body aches. Denies knowledge of fever.  Past Medical History:  Diagnosis Date  . Asthma   . Diabetes mellitus without complication [facility])     Patient Active Problem List   Diagnosis Date Noted  . Generalized abdominal pain 03/26/2020  . DOE (dyspnea on exertion) 03/26/2020  . Hyperlipidemia LDL goal <100 07/24/2013  . Esophageal reflux 07/24/2013  . Diabetes (HCC) 07/13/2013  . Asthma, chronic 12/16/2012    Past Surgical History:  Procedure Laterality Date  . L. arm surgery         Home Medications    Prior to Admission medications   Medication Sig Start Date End Date Taking? Authorizing Provider  albuterol (VENTOLIN HFA) 108 (90 Base) MCG/ACT inhaler 2 PUFFS 4 TIMES A DAY AS NEEDED FOR WHEEZING 11/18/19   Merlyn Albert, MD  benzonatate (TESSALON) 100 MG capsule Take 1-2 capsules (100-200 mg total) by mouth 3 (three) times daily as needed for cough. 07/11/20   Bing Neighbors, FNP  cyclobenzaprine (FLEXERIL) 10 MG tablet Take 1 tablet (10 mg total) by mouth at bedtime. 06/26/20   Wurst, [place], PA-C  diclofenac (VOLTAREN) 75 MG EC tablet Take 1 tablet (75 mg total) by mouth 2 (two) times daily as needed. With food 08/05/19   Merlyn Albert, MD  FLOVENT HFA 44 MCG/ACT inhaler TAKE 2 PUFFS BY MOUTH TWICE A DAY 02/25/20   Ladona Ridgel, Malena M, DO  glipiZIDE (GLUCOTROL) 5 MG tablet Take 1/2 tab p.o. bid with meals. 06/16/20   Ladona Ridgel, Malena M, DO  ibuprofen  (ADVIL) 800 MG tablet Take 1 tablet (800 mg total) by mouth 3 (three) times daily. 03/17/20   Wurst, [place], PA-C  ipratropium (ATROVENT) 0.03 % nasal spray Place 2 sprays into both nostrils 2 (two) times daily as needed for rhinitis. 07/11/20   Bing Neighbors, FNP  levocetirizine (XYZAL) 5 MG tablet Take 1 tablet (5 mg total) by mouth every evening. 07/11/20   Bing Neighbors, FNP  pantoprazole (PROTONIX) 40 MG tablet Take 1 tablet (40 mg total) by mouth daily. 06/16/20   Ladona Ridgel, Malena M, DO  polyethylene glycol powder (MIRALAX) 17 GM/SCOOP powder Take 1 scoop (17 Gm) by mouth daily for constipation.  May mix with water, juice or milk. Patient not taking: Reported on 03/26/2020 03/23/20   Durward Parcel, FNP  pravastatin (PRAVACHOL) 80 MG tablet Take 1 tablet (80 mg total) by mouth at bedtime. 06/17/20   Ladona Ridgel, Malena M, DO  predniSONE (STERAPRED UNI-PAK 21 TAB) 10 MG (21) TBPK tablet Take by mouth daily. Take 6 tabs by mouth daily  for 2 days, then 5 tabs for 2 days, then 4 tabs for 2 days, then 3 tabs for 2 days, 2 tabs for 2 days, then 1 tab by mouth daily for 2 days 06/26/20   Alvino Chapel [place], PA-C    Family History Family History  Problem Relation Age of Onset  . Heart attack  Father     Social History Social History   Tobacco Use  . Smoking status: Never Smoker  . Smokeless tobacco: Never Used  Substance Use Topics  . Alcohol use: No  . Drug use: No     Allergies   Patient has no known allergies.   Review of Systems Review of Systems Pertinent negatives listed in HPI  Physical Exam Triage Vital Signs ED Triage Vitals  Enc Vitals Group     BP 07/11/20 1430 111/71     Pulse Rate 07/11/20 1430 (!) 106     Resp 07/11/20 1430 18     Temp 07/11/20 1430 97.9 F (36.6 C)     Temp Source 07/11/20 1430 Oral     SpO2 07/11/20 1430 97 %     Weight 07/11/20 1428 171 lb 15.3 oz (78 kg)     Height 07/11/20 1428 5\' 7"  (1.702 m)     Head Circumference --      Peak Flow  --      Pain Score 07/11/20 1428 0     Pain Loc --      Pain Edu? --      Excl. in GC? --    No data found.  Updated Vital Signs BP 111/71 (BP Location: Right Arm)   Pulse 88   Temp 97.9 F (36.6 C) (Oral)   Resp 18   Ht 5\' 7"  (1.702 m)   Wt 171 lb 15.3 oz (78 kg)   SpO2 97%   BMI 26.93 kg/m   Visual Acuity Right Eye Distance:   Left Eye Distance:   Bilateral Distance:    Right Eye Near:   Left Eye Near:    Bilateral Near:     Physical Exam General appearance: alert, ill appearance, no acute distress Head: Normocephalic, without obvious abnormality, atraumatic ENT: external ears normal, nares congestion present oropharynx normal Respiratory: Respirations even and unlabored, normal respiratory rate Heart: rate and rhythm normal. No gallop or murmurs noted on exam. Extremities: No gross deformities Skin: Skin color, texture, turgor normal. No rashes seen  Psych: Appropriate mood and affect   UC Treatments / Results  Labs (all labs ordered are listed, but only abnormal results are displayed) Labs Reviewed  COVID-19, FLU A+B AND RSV   Narrative:    Test(s) 140142-Influenza A, NAA; 140143-Influenza B, NAA; 140144- RSV, NAA was developed and its performance characteristics determined by [facility]. It has not been cleared or approved by the Food and Drug Administration. Performed at:  [address] [place], [address]  [place] Lab Director: [place] MD, Phone:  [phone]    EKG   Radiology No results found.  Procedures Procedures (including critical care time)  Medications Ordered in UC Medications - No data to display  Initial Impression / Assessment and Plan / UC Course  I have reviewed the triage vital signs and the nursing notes.  Pertinent labs & imaging results that were available during my care of the patient were reviewed by me and considered in my medical decision making (see chart for details).    Respiratory  panel pending. Explained antibiotics are not indicated in treatment of viral illness. See discharge medication prescribed for symptoms management. Final Clinical Impressions(s) / UC Diagnoses   Final diagnoses:  Body aches  Viral respiratory illness   Discharge Instructions   None    ED Prescriptions    Medication Sig Dispense Auth. Provider   levocetirizine (XYZAL) 5 MG tablet Take 1  tablet (5 mg total) by mouth every evening. 30 tablet Bing Neighbors, FNP   ipratropium (ATROVENT) 0.03 % nasal spray Place 2 sprays into both nostrils 2 (two) times daily as needed for rhinitis. 30 mL Bing Neighbors, FNP   benzonatate (TESSALON) 100 MG capsule Take 1-2 capsules (100-200 mg total) by mouth 3 (three) times daily as needed for cough. 40 capsule Bing Neighbors, FNP     PDMP not reviewed this encounter.   Bing Neighbors, [place] 07/15/20 [phone]

## 2020-07-11 NOTE — ED Triage Notes (Addendum)
Pt states he has had cough, nasal congestion and body aches that started a week ago.  Reports he is feeling better but still has a lot of yellow mucous coming from his nose and mild body aches.

## 2020-07-12 ENCOUNTER — Other Ambulatory Visit: Payer: Self-pay | Admitting: Family Medicine

## 2020-07-12 LAB — COVID-19, FLU A+B AND RSV
Influenza A, NAA: NOT DETECTED
Influenza B, NAA: NOT DETECTED
RSV, NAA: NOT DETECTED
SARS-CoV-2, NAA: NOT DETECTED

## 2020-08-02 ENCOUNTER — Other Ambulatory Visit: Payer: Self-pay | Admitting: Family Medicine

## 2020-08-02 NOTE — Telephone Encounter (Signed)
Routing to PCP- [facility] does not do prescriptions with this office- pt no longer under pre scribers care (was seen at Urgent Care)

## 2020-08-16 ENCOUNTER — Ambulatory Visit
Admission: EM | Admit: 2020-08-16 | Discharge: 2020-08-16 | Disposition: A | Discharge status: Home / Self Care | Payer: 59 | Attending: Family Medicine | Admitting: Family Medicine

## 2020-08-16 ENCOUNTER — Encounter: Payer: Self-pay | Admitting: Emergency Medicine

## 2020-08-16 ENCOUNTER — Other Ambulatory Visit: Payer: Self-pay

## 2020-08-16 DIAGNOSIS — R051 Acute cough: Secondary | ICD-10-CM | POA: Diagnosis not present

## 2020-08-16 DIAGNOSIS — J069 Acute upper respiratory infection, unspecified: Secondary | ICD-10-CM

## 2020-08-16 DIAGNOSIS — Z20822 Contact with and (suspected) exposure to covid-19: Secondary | ICD-10-CM

## 2020-08-16 NOTE — Discharge Instructions (Signed)
Go home to rest Drink plenty of fluids Take Tylenol for pain or fever You may take over-the-counter cough and cold medicines as needed You must quarantine at home until your test result is available You can check for your test result in MyChart  

## 2020-08-16 NOTE — ED Triage Notes (Addendum)
Cough, body aches, scratchy throat.  Does not want covid/flu test

## 2020-08-17 NOTE — ED Provider Notes (Signed)
[place]    CSN: [id number] Arrival date & time: 08/16/20  1612      History   Chief Complaint Chief Complaint  Patient presents with  . Cough    HPI Rodney Meyers is a 65 y.o. male.   HPI   Patient states he had a cough and sore throat since yesterday.  No fever or chills.  No headache or body aches.  He is requesting a Z-Pak.  He is  Covid vaccinated.  He would like Covid testing.  He was around family a lot over the Christmas and New Year's holidays.  He is scheduled to go back to work tomorrow.  He does have diabetes and underlying asthma.  Past Medical History:  Diagnosis Date  . Asthma   . Diabetes mellitus without complication [facility])     Patient Active Problem List   Diagnosis Date Noted  . Generalized abdominal pain 03/26/2020  . DOE (dyspnea on exertion) 03/26/2020  . Hyperlipidemia LDL goal <100 07/24/2013  . Esophageal reflux 07/24/2013  . Diabetes ([place]) 07/13/2013  . Asthma, chronic 12/16/2012    Past Surgical History:  Procedure Laterality Date  . L. arm surgery         Home Medications    Prior to Admission medications   Medication Sig Start Date End Date Taking? Authorizing Provider  albuterol (VENTOLIN HFA) 108 (90 Base) MCG/ACT inhaler 2 PUFFS 4 TIMES A DAY AS NEEDED FOR WHEEZING 11/18/19   Mikey Kirschner, MD  benzonatate (TESSALON) 100 MG capsule Take 1-2 capsules (100-200 mg total) by mouth 3 (three) times daily as needed for cough. 07/11/20   Scot Jun, FNP  cyclobenzaprine (FLEXERIL) 10 MG tablet Take 1 tablet (10 mg total) by mouth at bedtime. 06/26/20   Wurst, [place], PA-C  diclofenac (VOLTAREN) 75 MG EC tablet Take 1 tablet (75 mg total) by mouth 2 (two) times daily as needed. With food 08/05/19   Mikey Kirschner, MD  FLOVENT HFA 44 MCG/ACT inhaler TAKE 2 PUFFS BY MOUTH TWICE A DAY 07/13/20   Lovena Le, Malena M, DO  glipiZIDE (GLUCOTROL) 5 MG tablet Take 1/2 tab p.o. bid with meals. 06/16/20   Lovena Le, Malena M,  DO  ibuprofen (ADVIL) 800 MG tablet Take 1 tablet (800 mg total) by mouth 3 (three) times daily. 03/17/20   Wurst, [place], PA-C  ipratropium (ATROVENT) 0.03 % nasal spray Place 2 sprays into both nostrils 2 (two) times daily as needed for rhinitis. 07/11/20   Scot Jun, FNP  levocetirizine (XYZAL) 5 MG tablet TAKE 1 TABLET BY MOUTH EVERY DAY IN THE EVENING 08/02/20   Lovena Le, Malena M, DO  pantoprazole (PROTONIX) 40 MG tablet Take 1 tablet (40 mg total) by mouth daily. 06/16/20   Lovena Le, Malena M, DO  polyethylene glycol powder (MIRALAX) 17 GM/SCOOP powder Take 1 scoop (17 Gm) by mouth daily for constipation.  May mix with water, juice or milk. Patient not taking: Reported on 03/26/2020 03/23/20   Emerson Monte, FNP  pravastatin (PRAVACHOL) 80 MG tablet Take 1 tablet (80 mg total) by mouth at bedtime. 06/17/20   Lovena Le, Malena M, DO  predniSONE (STERAPRED UNI-PAK 21 TAB) 10 MG (21) TBPK tablet Take by mouth daily. Take 6 tabs by mouth daily  for 2 days, then 5 tabs for 2 days, then 4 tabs for 2 days, then 3 tabs for 2 days, 2 tabs for 2 days, then 1 tab by mouth daily for 2 days 06/26/20  Wurst, [place], PA-C    Family History Family History  Problem Relation Age of Onset  . Heart attack Father     Social History Social History   Tobacco Use  . Smoking status: Never Smoker  . Smokeless tobacco: Never Used  Substance Use Topics  . Alcohol use: No  . Drug use: No     Allergies   Patient has no known allergies.   Review of Systems Review of Systems  See HPI  physical Exam Triage Vital Signs ED Triage Vitals  Enc Vitals Group     BP 08/16/20 1742 101/67     Pulse Rate 08/16/20 1742 63     Resp 08/16/20 1742 18     Temp 08/16/20 1742 97.9 F (36.6 C)     Temp Source 08/16/20 1742 Oral     SpO2 08/16/20 1742 96 %     Weight --      Height --      Head Circumference --      Peak Flow --      Pain Score 08/16/20 1741 0     Pain Loc --      Pain Edu? --       Excl. in GC? --    No data found.  Updated Vital Signs BP 101/67 (BP Location: Right Arm)   Pulse 63   Temp 97.9 F (36.6 C) (Oral)   Resp 18   SpO2 96%      Physical Exam Constitutional:      General: He is not in acute distress.    Appearance: He is well-developed and well-nourished.  HENT:     Head: Normocephalic and atraumatic.     Right Ear: Tympanic membrane, ear canal and external ear normal.     Left Ear: Tympanic membrane, ear canal and external ear normal.     Nose: Nose normal.     Mouth/Throat:     Mouth: Oropharynx is clear and moist.     Pharynx: No posterior oropharyngeal erythema.  Eyes:     Conjunctiva/sclera: Conjunctivae normal.     Pupils: Pupils are equal, round, and reactive to light.  Cardiovascular:     Rate and Rhythm: Normal rate and regular rhythm.     Heart sounds: Normal heart sounds.  Pulmonary:     Effort: Pulmonary effort is normal. No respiratory distress.     Breath sounds: Normal breath sounds.  Abdominal:     General: There is no distension.     Palpations: Abdomen is soft.  Musculoskeletal:        General: No edema. Normal range of motion.     Cervical back: Normal range of motion.  Skin:    General: Skin is warm and dry.  Neurological:     General: No focal deficit present.     Mental Status: He is alert.  Psychiatric:        Mood and Affect: Mood normal.      UC Treatments / Results  Labs (all labs ordered are listed, but only abnormal results are displayed) Labs Reviewed - No data to display  EKG   Radiology No results found.  Procedures Procedures (including critical care time)  Medications Ordered in UC Medications - No data to display  Initial Impression / Assessment and Plan / UC Course  I have reviewed the triage vital signs and the nursing notes.  Pertinent labs & imaging results that were available during my care of the patient were reviewed by  me and considered in my medical decision making (see  chart for details).     Exam is normal.  Z-Pak: Not indicated Final Clinical Impressions(s) / UC Diagnoses   Final diagnoses:  Viral URI with cough  Encounter for laboratory testing for COVID-19 virus     Discharge Instructions     Go home to rest Drink plenty of fluids Take Tylenol for pain or fever You may take over-the-counter cough and cold medicines as needed You must quarantine at home until your test result is available You can check for your test result in MyChart    ED Prescriptions    None     PDMP not reviewed this encounter.   Eustace Moore, MD 08/17/20 [phone]

## 2020-08-26 ENCOUNTER — Other Ambulatory Visit: Payer: Self-pay | Admitting: Family Medicine

## 2020-09-26 IMAGING — CT CT RENAL STONE PROTOCOL
2 of 4 series · 16 of 46 positions shown, 18 images · non-contrast
Comparison: CT of the abdomen pelvis dated 05/09/2017

CLINICAL DATA: 63-year-old male with flank pain. Concern for kidney
stone.

EXAM:
CT ABDOMEN AND PELVIS WITHOUT CONTRAST
TECHNIQUE: Multidetector CT imaging of the abdomen and pelvis was performed
following the standard protocol without IV contrast.

[Series 2: axial st · axial · 0.78mm/px · z∈[-478,-58]mm · 13 of 96 slices shown, 15 images]
[im 6/96  soft-tissue]
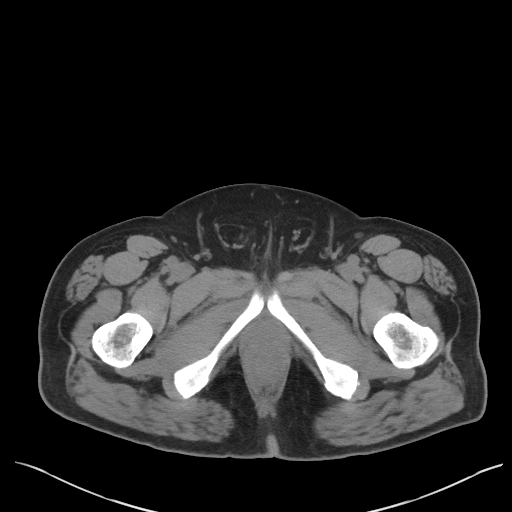
[im 6/96  bone]
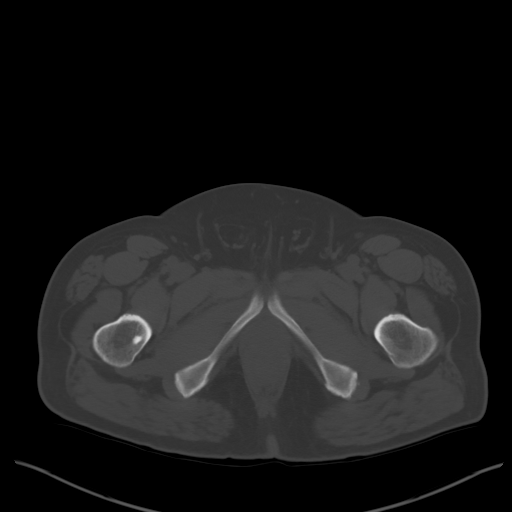
[im 11/96  soft-tissue]
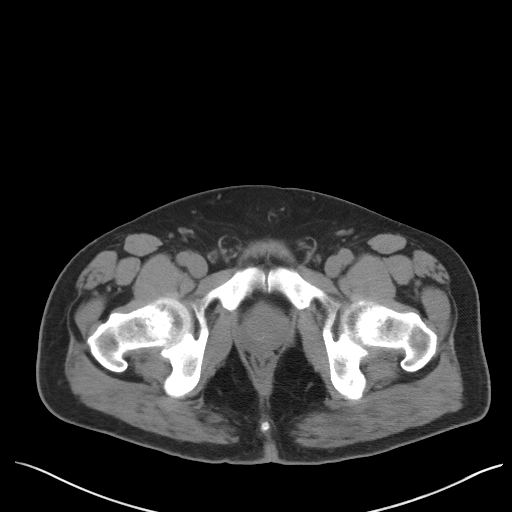
[im 22/96  soft-tissue]
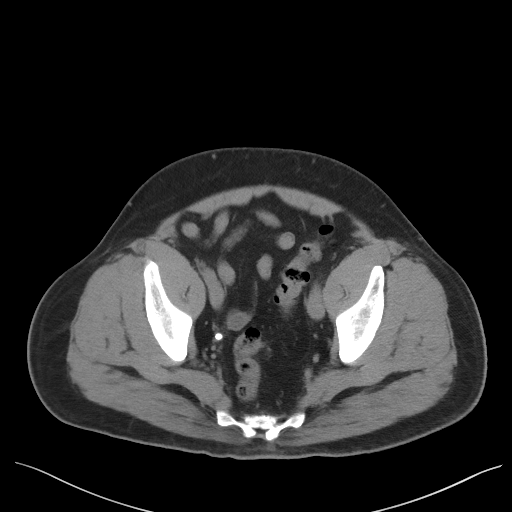
[im 27/96  soft-tissue]
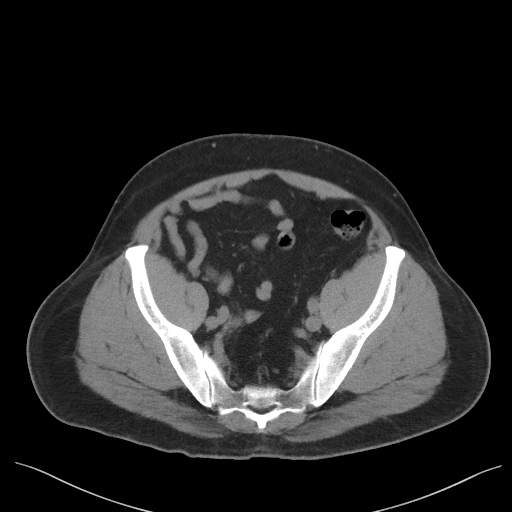
[im 32/96  soft-tissue]
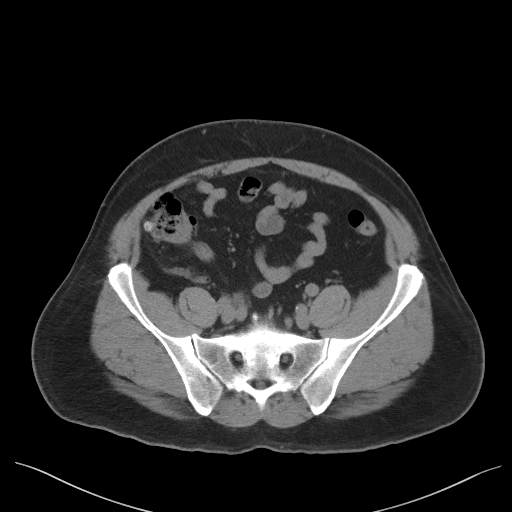
[im 43/96  soft-tissue]
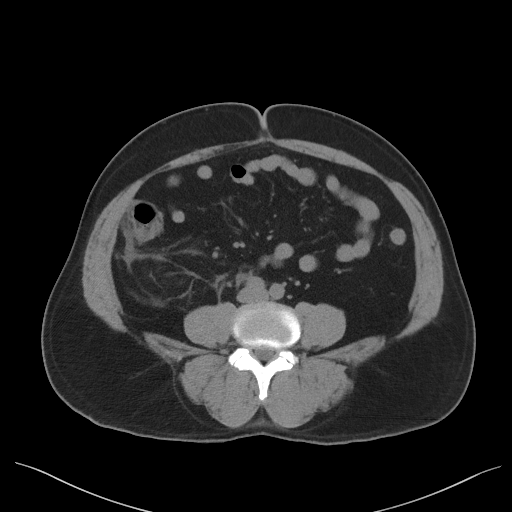
[im 48/96  soft-tissue]
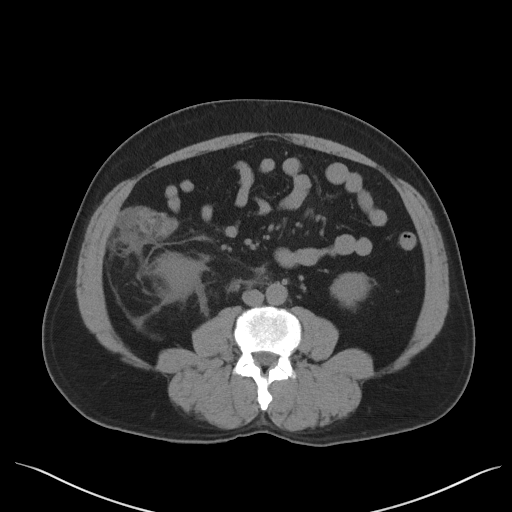
[im 53/96  soft-tissue]
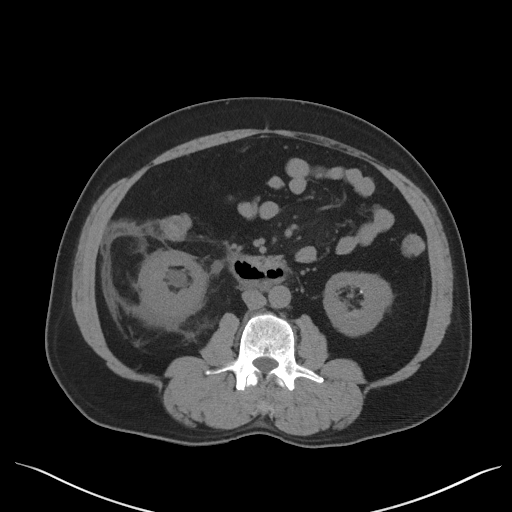
[im 64/96  soft-tissue]
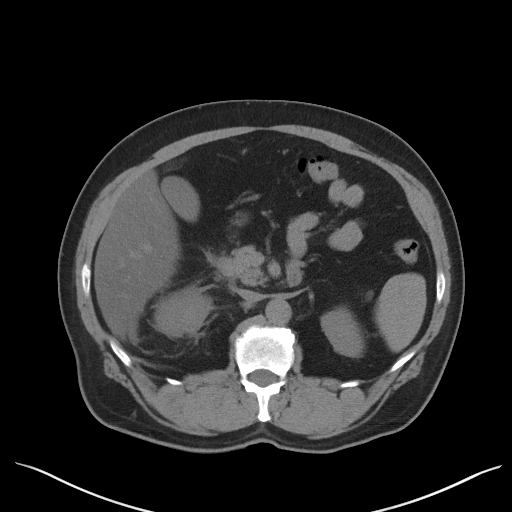
[im 64/96  bone]
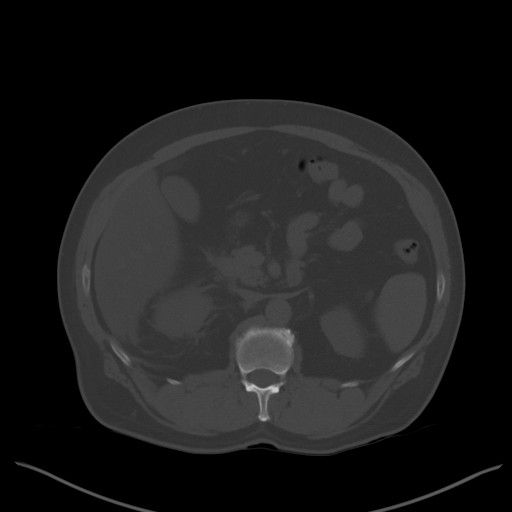
[im 69/96  soft-tissue]
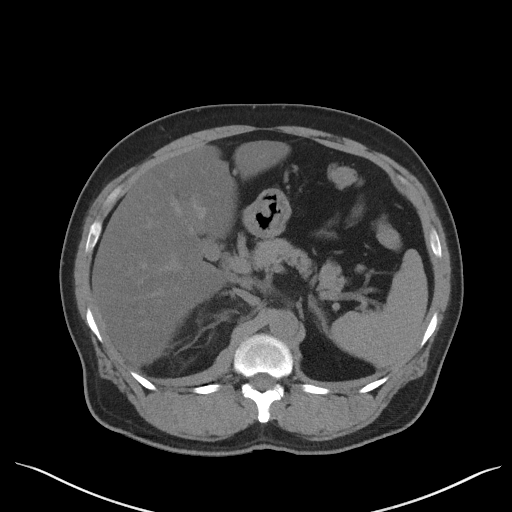
[im 74/96  soft-tissue]
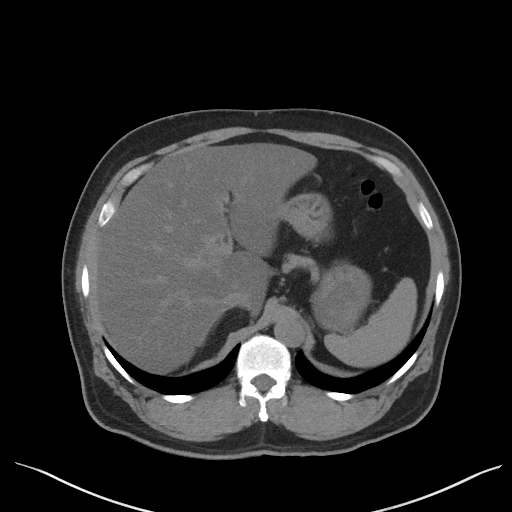
[im 85/96  soft-tissue]
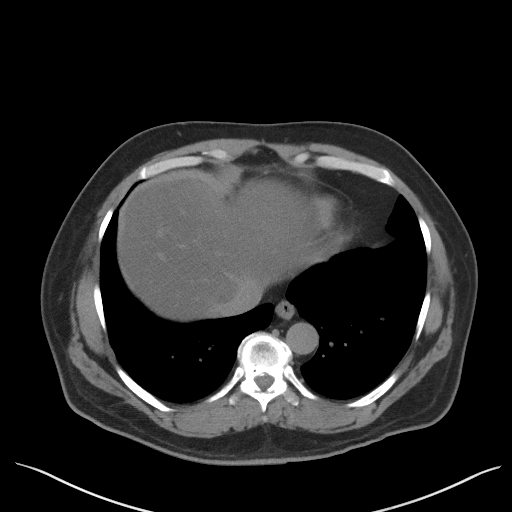
[im 90/96  soft-tissue]
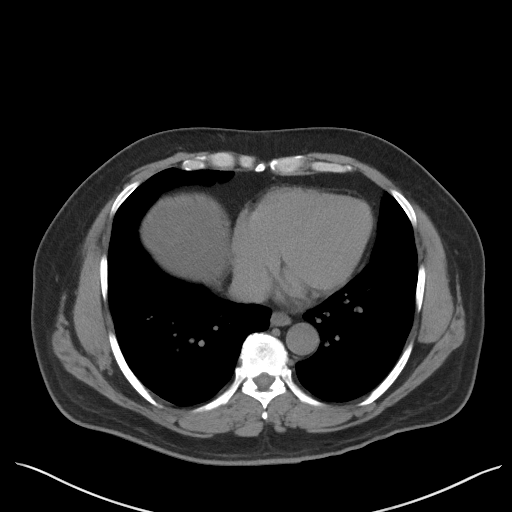

[Series 5: coronal st · coronal · 0.80mm/px · 3 of 93 slices shown]
[im 31/93  soft-tissue]
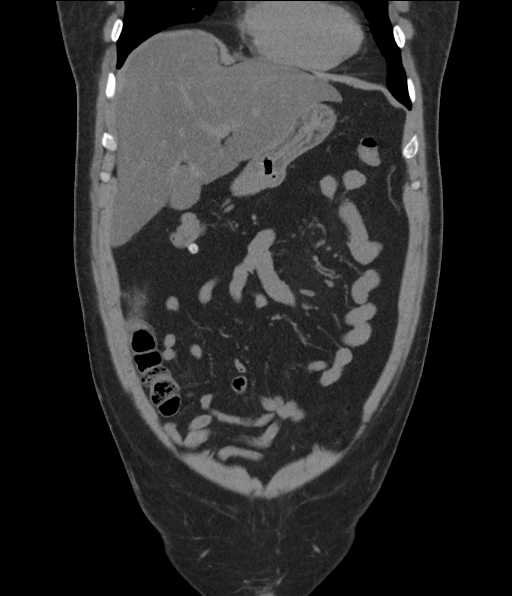
[im 41/93  soft-tissue]
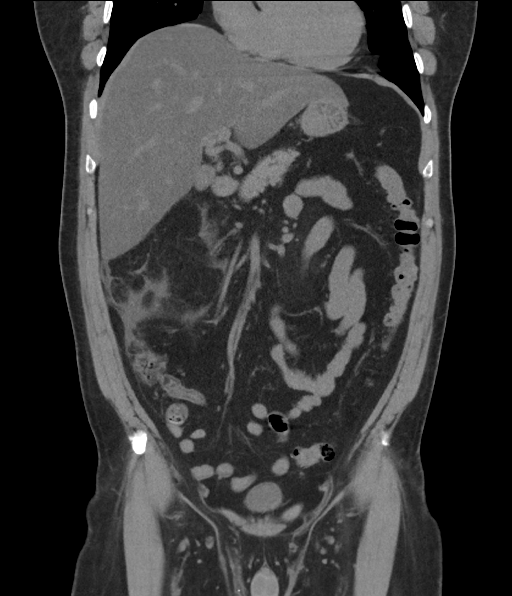
[im 52/93  soft-tissue]
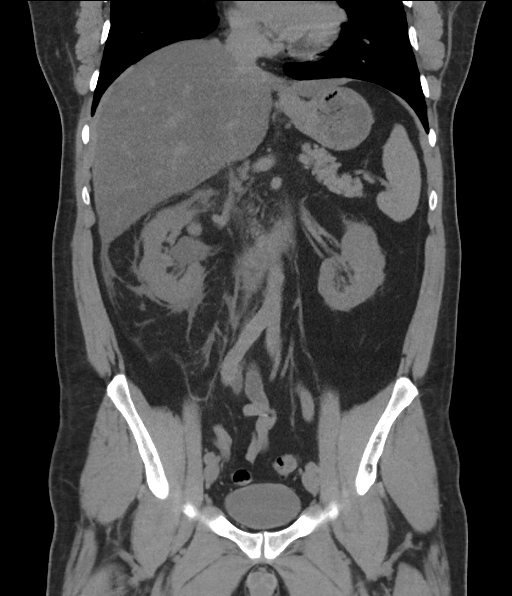

[16 of 46 positions shown; findings below may reference images not displayed]

FINDINGS: Evaluation of this exam is limited in the absence of intravenous
contrast.

Lower chest: The visualized lung bases are clear.

No intra-abdominal free air or free fluid.

Hepatobiliary: There is diffuse fatty infiltration of the liver. No
intrahepatic biliary ductal dilatation. Probable hyper concentrated
bile or sludge within the gallbladder. No calcified stone.

Pancreas: Unremarkable. No pancreatic ductal dilatation or
surrounding inflammatory changes.

Spleen: Normal in size without focal abnormality.

Adrenals/Urinary Tract: The adrenal glands are unremarkable. There
is an obstructing stone in the distal right ureter measuring up to 9
mm in greatest dimension. There is mild right hydronephrosis.
Moderate right perinephric stranding extending inferiorly along the
right paracolic gutter. Several additional bilateral nonobstructing
renal calculi noted measuring up to 5 mm in the inferior pole of the
left kidney. There is no hydronephrosis on the left. The left ureter
and urinary bladder appear unremarkable.

Stomach/Bowel: There is sigmoid diverticulosis and scattered colonic
diverticula without active inflammatory changes. There is no bowel
obstruction or active inflammation. The appendix is normal.

Vascular/Lymphatic: The abdominal aorta and IVC appear unremarkable
on this noncontrast CT. No portal venous gas. There is no
adenopathy.

Reproductive: The prostate and seminal vesicles are grossly
unremarkable. No pelvic mass.

Other: None

Musculoskeletal: No acute or significant osseous findings.
IMPRESSION: 1. Obstructing 9 mm distal right ureteral stone with mild right
hydronephrosis.
2. Several additional nonobstructing bilateral renal calculi noted.
No hydronephrosis on the left.
3. Fatty liver.
4. Colonic diverticulosis. No bowel obstruction or active
inflammation. Normal appendix.

## 2020-12-11 ENCOUNTER — Other Ambulatory Visit: Payer: Self-pay | Admitting: Family Medicine

## 2021-01-20 ENCOUNTER — Telehealth: Payer: Self-pay | Admitting: Family Medicine

## 2021-01-20 MED ORDER — ALBUTEROL SULFATE HFA 108 (90 BASE) MCG/ACT IN AERS
INHALATION_SPRAY | RESPIRATORY_TRACT | 0 refills | Status: DC
Start: 2021-01-20 — End: 2021-09-09

## 2021-01-20 NOTE — Telephone Encounter (Signed)
[facility] [place] requesting refill on Albuterol Inhaler. Inhale 2 puffs 4 times a day prn wheezing. Pt last seen 06/16/20 for DM. Please advise. Thank you

## 2021-01-20 NOTE — Addendum Note (Signed)
Addended by: Annalee Genta on: 01/20/2021 11:56 AM   Modules accepted: Orders

## 2021-01-24 ENCOUNTER — Telehealth: Payer: Self-pay | Admitting: Family Medicine

## 2021-01-25 NOTE — Telephone Encounter (Signed)
Please contact patient to him set up appt. Thank you!

## 2021-01-26 NOTE — Telephone Encounter (Signed)
Sent my chart message to schedule appt

## 2021-01-28 NOTE — Telephone Encounter (Signed)
Pt need labs and appt in next 30 days.  pls order cbc, cmp, lpids, and a1c.   Thx.   Dr. Ladona Ridgel

## 2021-02-02 ENCOUNTER — Other Ambulatory Visit: Payer: Self-pay | Admitting: Family Medicine

## 2021-02-02 DIAGNOSIS — E119 Type 2 diabetes mellitus without complications: Secondary | ICD-10-CM

## 2021-02-02 DIAGNOSIS — E785 Hyperlipidemia, unspecified: Secondary | ICD-10-CM

## 2021-02-02 DIAGNOSIS — Z79899 Other long term (current) drug therapy: Secondary | ICD-10-CM

## 2021-02-02 NOTE — Telephone Encounter (Signed)
Lab orders placed and my chart message sent to patient.  

## 2021-02-17 NOTE — Telephone Encounter (Signed)
Called twice and sent my chart message 

## 2021-02-25 ENCOUNTER — Other Ambulatory Visit: Payer: Self-pay | Admitting: Family Medicine

## 2021-03-18 ENCOUNTER — Other Ambulatory Visit: Payer: Self-pay | Admitting: Family Medicine

## 2021-04-25 ENCOUNTER — Other Ambulatory Visit: Payer: Self-pay | Admitting: Family Medicine

## 2021-05-18 ENCOUNTER — Other Ambulatory Visit: Payer: Self-pay | Admitting: Family Medicine

## 2021-06-17 ENCOUNTER — Other Ambulatory Visit: Payer: Self-pay | Admitting: Family Medicine

## 2021-06-22 NOTE — Telephone Encounter (Signed)
Sent my chart message to schedule appointment on 06/22/21

## 2021-07-05 NOTE — Telephone Encounter (Signed)
Second request to schedule appt. 07/05/21 

## 2021-08-05 NOTE — Telephone Encounter (Signed)
Called and sent to message to schedule medication follow up with no response

## 2021-08-06 ENCOUNTER — Other Ambulatory Visit: Payer: Self-pay | Admitting: Family Medicine

## 2021-08-06 MED ORDER — PANTOPRAZOLE SODIUM 40 MG PO TBEC
40.0000 mg | DELAYED_RELEASE_TABLET | Freq: Every day | ORAL | 0 refills | Status: DC
Start: 2021-08-06 — End: 2022-09-20

## 2021-09-09 ENCOUNTER — Encounter: Payer: Self-pay | Admitting: Emergency Medicine

## 2021-09-09 ENCOUNTER — Other Ambulatory Visit: Payer: Self-pay

## 2021-09-09 ENCOUNTER — Ambulatory Visit
Admission: EM | Admit: 2021-09-09 | Discharge: 2021-09-09 | Disposition: A | Discharge status: Home / Self Care | Payer: BC Managed Care – PPO | Attending: Urgent Care | Admitting: Urgent Care

## 2021-09-09 DIAGNOSIS — E119 Type 2 diabetes mellitus without complications: Secondary | ICD-10-CM | POA: Diagnosis not present

## 2021-09-09 DIAGNOSIS — Z9189 Other specified personal risk factors, not elsewhere classified: Secondary | ICD-10-CM | POA: Diagnosis not present

## 2021-09-09 DIAGNOSIS — R52 Pain, unspecified: Secondary | ICD-10-CM

## 2021-09-09 DIAGNOSIS — J4530 Mild persistent asthma, uncomplicated: Secondary | ICD-10-CM

## 2021-09-09 DIAGNOSIS — B349 Viral infection, unspecified: Secondary | ICD-10-CM

## 2021-09-09 DIAGNOSIS — R052 Subacute cough: Secondary | ICD-10-CM

## 2021-09-09 MED ORDER — PROMETHAZINE-DM 6.25-15 MG/5ML PO SYRP: 5.0000 mL | ORAL_SOLUTION | Freq: Every evening | ORAL | 0 refills | Status: DC | PRN

## 2021-09-09 MED ORDER — IPRATROPIUM BROMIDE 0.03 % NA SOLN: 2.0000 | Freq: Two times a day (BID) | NASAL | 0 refills | Status: AC

## 2021-09-09 MED ORDER — CETIRIZINE HCL 10 MG PO TABS: 10.0000 mg | ORAL_TABLET | Freq: Every day | ORAL | 0 refills | Status: DC

## 2021-09-09 MED ORDER — ALBUTEROL SULFATE HFA 108 (90 BASE) MCG/ACT IN AERS: 1.0000 | INHALATION_SPRAY | Freq: Four times a day (QID) | RESPIRATORY_TRACT | 0 refills | Status: DC | PRN

## 2021-09-09 MED ORDER — BENZONATATE 100 MG PO CAPS: 100.0000 mg | ORAL_CAPSULE | Freq: Three times a day (TID) | ORAL | 0 refills | Status: DC | PRN

## 2021-09-09 NOTE — ED Triage Notes (Signed)
Runny nose, fever, body aches and cough since last night.

## 2021-09-09 NOTE — Discharge Instructions (Signed)
We will notify you of your test results as they arrive and may take between 48-72 hours.  I encourage you to sign up for MyChart if you have not already done so as this can be the easiest way for [place] to communicate results to you online or through a phone app.  Generally, we only contact you if it is a positive test result.  In the meantime, if you develop worsening symptoms including fever, chest pain, shortness of breath despite our current treatment plan then please report to the emergency room as this may be a sign of worsening status from possible viral infection.  Otherwise, we will manage this as a viral syndrome. For sore throat or cough try using a honey-based tea. Use 3 teaspoons of honey with juice squeezed from half lemon. Place shaved pieces of ginger into 1/2-1 cup of water and warm over stove top. Then mix the ingredients and repeat every 4 hours as needed. Please take Tylenol 500mg -650mg  every 6 hours for aches and pains, fevers. Hydrate very well with at least 2 liters of water. Eat light meals such as soups to replenish electrolytes and soft fruits, veggies. Start an antihistamine like Zyrtec for postnasal drainage, sinus congestion.  You can take this together with the nasal spray for the same symptoms. Use the cough medications as needed.   If your COVID test is positive, you would be a good candidate to receive COVID antiviral medications. That is why we are getting bloodwork to make sure you can take this medication.

## 2021-09-10 LAB — COVID-19, FLU A+B NAA
Influenza A, NAA: NOT DETECTED
Influenza B, NAA: NOT DETECTED
SARS-CoV-2, NAA: NOT DETECTED

## 2021-09-10 LAB — COMPREHENSIVE METABOLIC PANEL
ALT: 24 IU/L (ref 0–44)
AST: 23 IU/L (ref 0–40)
Albumin/Globulin Ratio: 1.7 (ref 1.2–2.2)
Albumin: 4.7 g/dL (ref 3.8–4.8)
Alkaline Phosphatase: 97 IU/L (ref 44–121)
BUN/Creatinine Ratio: 13 (ref 10–24)
BUN: 16 mg/dL (ref 8–27)
Bilirubin Total: 0.4 mg/dL (ref 0.0–1.2)
CO2: 18 mmol/L — ABNORMAL LOW (ref 20–29)
Calcium: 9.2 mg/dL (ref 8.6–10.2)
Chloride: 105 mmol/L (ref 96–106)
Creatinine, Ser: 1.21 mg/dL (ref 0.76–1.27)
Globulin, Total: 2.8 g/dL (ref 1.5–4.5)
Glucose: 127 mg/dL — ABNORMAL HIGH (ref 70–99)
Potassium: 4.3 mmol/L (ref 3.5–5.2)
Sodium: 141 mmol/L (ref 134–144)
Total Protein: 7.5 g/dL (ref 6.0–8.5)
eGFR: 67 mL/min/{1.73_m2} (ref >59)

## 2022-01-12 DIAGNOSIS — K219 Gastro-esophageal reflux disease without esophagitis: Secondary | ICD-10-CM | POA: Diagnosis not present

## 2022-01-12 DIAGNOSIS — E119 Type 2 diabetes mellitus without complications: Secondary | ICD-10-CM | POA: Diagnosis not present

## 2022-01-12 DIAGNOSIS — J45909 Unspecified asthma, uncomplicated: Secondary | ICD-10-CM | POA: Diagnosis not present

## 2022-05-22 ENCOUNTER — Ambulatory Visit
Admission: EM | Admit: 2022-05-22 | Discharge: 2022-05-22 | Disposition: A | Discharge status: Home / Self Care | Payer: BC Managed Care – PPO | Attending: Family Medicine | Admitting: Family Medicine

## 2022-05-22 ENCOUNTER — Encounter: Payer: Self-pay | Admitting: Emergency Medicine

## 2022-05-22 DIAGNOSIS — J069 Acute upper respiratory infection, unspecified: Secondary | ICD-10-CM | POA: Insufficient documentation

## 2022-05-22 DIAGNOSIS — Z1152 Encounter for screening for COVID-19: Secondary | ICD-10-CM | POA: Insufficient documentation

## 2022-05-22 DIAGNOSIS — J3089 Other allergic rhinitis: Secondary | ICD-10-CM | POA: Insufficient documentation

## 2022-05-22 LAB — RESP PANEL BY RT-PCR (FLU A&B, COVID) ARPGX2
Influenza A by PCR: NEGATIVE
Influenza B by PCR: NEGATIVE
SARS Coronavirus 2 by RT PCR: NEGATIVE

## 2022-05-22 MED ORDER — FLUTICASONE PROPIONATE 50 MCG/ACT NA SUSP: 1.0000 | Freq: Two times a day (BID) | NASAL | 2 refills | Status: DC

## 2022-05-22 MED ORDER — CETIRIZINE HCL 10 MG PO TABS: 10.0000 mg | ORAL_TABLET | Freq: Every day | ORAL | 2 refills | Status: DC

## 2022-05-22 MED ORDER — ALBUTEROL SULFATE HFA 108 (90 BASE) MCG/ACT IN AERS: 2.0000 | INHALATION_SPRAY | RESPIRATORY_TRACT | 0 refills | Status: DC | PRN

## 2022-05-22 MED ORDER — PROMETHAZINE-DM 6.25-15 MG/5ML PO SYRP: 5.0000 mL | ORAL_SOLUTION | Freq: Every evening | ORAL | 0 refills | Status: DC | PRN

## 2022-05-22 NOTE — ED Provider Notes (Signed)
[facility]    CSN: [id number] Arrival date & time: 05/22/22  0850      History   Chief Complaint Chief Complaint  Patient presents with   Cough    HPI Rodney Meyers is a 66 y.o. male.   Patient presenting today with 1 day history of cough, sore throat, sneezing, runny nose.  Denies fever, chills, body aches, chest pain, shortness of breath, abdominal pain, nausea vomiting or diarrhea.  Trying Delsym with minimal relief.  History of seasonal allergies and asthma not currently on anything for these.  No known sick contacts recently.    Past Medical History:  Diagnosis Date   Asthma    Diabetes mellitus without complication [facility])     Patient Active Problem List   Diagnosis Date Noted   Generalized abdominal pain 03/26/2020   DOE (dyspnea on exertion) 03/26/2020   Hyperlipidemia LDL goal <100 07/24/2013   Esophageal reflux 07/24/2013   Diabetes (HCC) 07/13/2013   Asthma, chronic 12/16/2012    Past Surgical History:  Procedure Laterality Date   L. arm surgery         Home Medications    Prior to Admission medications   Medication Sig Start Date End Date Taking? Authorizing Provider  benzonatate (TESSALON) 100 MG capsule Take 1-2 capsules (100-200 mg total) by mouth 3 (three) times daily as needed for cough. 09/09/21  Yes Wallis Bamberg, PA-C  cyclobenzaprine (FLEXERIL) 10 MG tablet Take 1 tablet (10 mg total) by mouth at bedtime. 06/26/20  Yes Wurst, [place], PA-C  diclofenac (VOLTAREN) 75 MG EC tablet Take 1 tablet (75 mg total) by mouth 2 (two) times daily as needed. With food 08/05/19  Yes Merlyn Albert, MD  FLOVENT HFA 44 MCG/ACT inhaler TAKE 2 PUFFS BY MOUTH TWICE A DAY 07/13/20  Yes Ladona Ridgel, Malena M, DO  fluticasone (FLONASE) 50 MCG/ACT nasal spray Place 1 spray into both nostrils 2 (two) times daily. 05/22/22  Yes Particia Nearing, PA-C  glipiZIDE (GLUCOTROL) 5 MG tablet TAKE 1 TABLET IN MORNING AND 1/2 TABLET BY MOUTH IN THE EVENING  04/26/21  Yes Ladona Ridgel, Malena M, DO  ibuprofen (ADVIL) 800 MG tablet Take 1 tablet (800 mg total) by mouth 3 (three) times daily. 03/17/20  Yes Wurst, [place], PA-C  ipratropium (ATROVENT) 0.03 % nasal spray Place 2 sprays into both nostrils 2 (two) times daily. 09/09/21  Yes Wallis Bamberg, PA-C  levocetirizine (XYZAL) 5 MG tablet TAKE 1 TABLET BY MOUTH EVERY DAY IN THE EVENING 08/26/20  Yes Ladona Ridgel, Malena M, DO  pantoprazole (PROTONIX) 40 MG tablet Take 1 tablet (40 mg total) by mouth daily. 08/06/21  Yes Cook, Jayce G, DO  polyethylene glycol powder (MIRALAX) 17 GM/SCOOP powder Take 1 scoop (17 Gm) by mouth daily for constipation.  May mix with water, juice or milk. 03/23/20  Yes Avegno, Zachery Dakins, FNP  pravastatin (PRAVACHOL) 80 MG tablet Take 1 tablet (80 mg total) by mouth at bedtime. 06/17/20  Yes Taylor, Malena M, DO  albuterol (VENTOLIN HFA) 108 (90 Base) MCG/ACT inhaler Inhale 2 puffs into the lungs every 4 (four) hours as needed for wheezing or shortness of breath. 05/22/22   Particia Nearing, PA-C  cetirizine (ZYRTEC ALLERGY) 10 MG tablet Take 1 tablet (10 mg total) by mouth daily. 05/22/22   Particia Nearing, PA-C  promethazine-dextromethorphan (PROMETHAZINE-DM) 6.25-15 MG/5ML syrup Take 5 mLs by mouth at bedtime as needed for cough. 05/22/22   Particia Nearing, PA-C    Family  History Family History  Problem Relation Age of Onset   Heart attack Father     Social History Social History   Tobacco Use   Smoking status: Never   Smokeless tobacco: Never  Vaping Use   Vaping Use: Never used  Substance Use Topics   Alcohol use: No   Drug use: No     Allergies   Patient has no known allergies.   Review of Systems Review of Systems Per HPI  Physical Exam Triage Vital Signs ED Triage Vitals  Enc Vitals Group     BP 05/22/22 1056 (!) 143/76     Pulse Rate 05/22/22 1056 61     Resp 05/22/22 1056 18     Temp 05/22/22 1056 97.9 F (36.6 C)     Temp Source 05/22/22  1056 Oral     SpO2 05/22/22 1056 97 %     Weight 05/22/22 1057 170 lb (77.1 kg)     Height 05/22/22 1057 5\' 7"  (1.702 m)     Head Circumference --      Peak Flow --      Pain Score 05/22/22 1057 0     Pain Loc --      Pain Edu? --      Excl. in [place]? --    No data found.  Updated Vital Signs BP (!) 143/76 (BP Location: Right Arm)   Pulse 61   Temp 97.9 F (36.6 C) (Oral)   Resp 18   Ht 5\' 7"  (1.702 m)   Wt 170 lb (77.1 kg)   SpO2 97%   BMI 26.63 kg/m   Visual Acuity Right Eye Distance:   Left Eye Distance:   Bilateral Distance:    Right Eye Near:   Left Eye Near:    Bilateral Near:     Physical Exam Vitals and nursing note reviewed.  Constitutional:      Appearance: Normal appearance. He is well-developed.  HENT:     Head: Atraumatic.     Right Ear: External ear normal.     Left Ear: External ear normal.     Nose: Rhinorrhea present.     Mouth/Throat:     Pharynx: Posterior oropharyngeal erythema present. No oropharyngeal exudate.  Eyes:     Extraocular Movements: Extraocular movements intact.     Conjunctiva/sclera: Conjunctivae normal.     Pupils: Pupils are equal, round, and reactive to light.  Cardiovascular:     Rate and Rhythm: Normal rate and regular rhythm.  Pulmonary:     Effort: Pulmonary effort is normal. No respiratory distress.     Breath sounds: Normal breath sounds. No wheezing or rales.  Musculoskeletal:        General: Normal range of motion.     Cervical back: Normal range of motion and neck supple.  Lymphadenopathy:     Cervical: No cervical adenopathy.  Skin:    General: Skin is warm and dry.  Neurological:     General: No focal deficit present.     Mental Status: He is alert and oriented to person, place, and time.  Psychiatric:        Mood and Affect: Mood normal.        Behavior: Behavior normal.        Thought Content: Thought content normal.        Judgment: Judgment normal.      UC Treatments / Results  Labs (all labs  ordered are listed, but only abnormal results are displayed) Labs  Reviewed  RESP PANEL BY RT-PCR (FLU A&B, COVID) [id number]    EKG   Radiology No results found.  Procedures Procedures (including critical care time)  Medications Ordered in UC Medications - No data to display  Initial Impression / Assessment and Plan / UC Course  I have reviewed the triage vital signs and the nursing notes.  Pertinent labs & imaging results that were available during my care of the patient were reviewed by me and considered in my medical decision making (see chart for details).     Suspect viral upper respiratory infection, vitals and exam overall reassuring, respiratory panel pending, will restart good allergy regimen, refill his albuterol inhaler and provide Phenergan DM for cough and congestion.  Discussed a list of safe over-the-counter medications for his symptoms.  Return for worsening symptoms.  Final Clinical Impressions(s) / UC Diagnoses   Final diagnoses:  Viral URI with cough  Seasonal allergic rhinitis due to other allergic trigger     Discharge Instructions      In addition to what I have prescribed for you today, Coricidin HBP and plain Mucinex are safe over-the-counter options for your symptoms.  Have tested you for COVID and flu today and should have those results back for you tomorrow.  We will call if anything comes back positive.    ED Prescriptions     Medication Sig Dispense Auth. Provider   albuterol (VENTOLIN HFA) 108 (90 Base) MCG/ACT inhaler Inhale 2 puffs into the lungs every 4 (four) hours as needed for wheezing or shortness of breath. 18 g Particia Nearing, [place]   cetirizine (ZYRTEC ALLERGY) 10 MG tablet Take 1 tablet (10 mg total) by mouth daily. 30 tablet Particia Nearing, [place]   promethazine-dextromethorphan (PROMETHAZINE-DM) 6.25-15 MG/5ML syrup Take 5 mLs by mouth at bedtime as needed for cough. 100 mL Particia Nearing, PA-C   fluticasone  [facility]) 50 MCG/ACT nasal spray Place 1 spray into both nostrils 2 (two) times daily. 16 g Particia Nearing, [place]      PDMP not reviewed this encounter.   Particia Nearing, [place] 05/22/22 1123

## 2022-05-22 NOTE — ED Triage Notes (Signed)
Patient c/o productive cough, sore throat and nasal drainage x 1 day.  Patient has taken OTC Delsym w/o relief.

## 2022-05-22 NOTE — Discharge Instructions (Signed)
In addition to what I have prescribed for you today, Coricidin HBP and plain Mucinex are safe over-the-counter options for your symptoms.  Have tested you for COVID and flu today and should have those results back for you tomorrow.  We will call if anything comes back positive.

## 2022-05-23 ENCOUNTER — Ambulatory Visit (INDEPENDENT_AMBULATORY_CARE_PROVIDER_SITE_OTHER): Payer: BC Managed Care – PPO | Admitting: Family Medicine

## 2022-05-23 ENCOUNTER — Encounter: Payer: Self-pay | Admitting: Family Medicine

## 2022-05-23 VITALS — BP 142/78 | HR 76 | Temp 98.7°F | Ht 67.0 in | Wt 172.0 lb

## 2022-05-23 DIAGNOSIS — Z125 Encounter for screening for malignant neoplasm of prostate: Secondary | ICD-10-CM

## 2022-05-23 DIAGNOSIS — E782 Mixed hyperlipidemia: Secondary | ICD-10-CM | POA: Diagnosis not present

## 2022-05-23 DIAGNOSIS — E119 Type 2 diabetes mellitus without complications: Secondary | ICD-10-CM

## 2022-05-23 DIAGNOSIS — M2550 Pain in unspecified joint: Secondary | ICD-10-CM | POA: Diagnosis not present

## 2022-05-23 DIAGNOSIS — K219 Gastro-esophageal reflux disease without esophagitis: Secondary | ICD-10-CM | POA: Insufficient documentation

## 2022-05-23 DIAGNOSIS — R030 Elevated blood-pressure reading, without diagnosis of hypertension: Secondary | ICD-10-CM | POA: Insufficient documentation

## 2022-05-23 MED ORDER — MELOXICAM 15 MG PO TABS: 15.0000 mg | ORAL_TABLET | Freq: Every day | ORAL | 3 refills | Status: DC

## 2022-05-23 NOTE — Assessment & Plan Note (Signed)
Uncertain etiology at this time. Further work up with labs today. Mobic as directed.

## 2022-05-23 NOTE — Progress Notes (Signed)
Subjective:  Patient ID: Rodney Meyers, male    DOB: 08-10-1956  Age: 66 y.o. MRN: [id number]  CC: Chief Complaint  Patient presents with   Establish Care    All over joint pain worse now along with cramps in feet and under L breast w/ sneezing Pain in hands, low back knees, arms    HPI:  66 year old male with Asthma, Hyperlipidemia, DM-2 presents to establish care.  No recent A1C or Lipid panel available. Currently taking Glipizide and Pravastatin. No hypoglycemia.  He reports chronic joint pain for the past 2 years (low back, knees, hands, elbows, ankles). Takes Aspirin periodically. Denies joint swelling and redness. No relieving factors. No recent fall or injury.    Patient Active Problem List   Diagnosis Date Noted   GERD (gastroesophageal reflux disease) 05/23/2022   Arthralgia 05/23/2022   Elevated BP without diagnosis of hypertension 05/23/2022   Mixed hyperlipidemia 07/24/2013   Diabetes ([place]) 07/13/2013   Asthma, chronic 12/16/2012    Social Hx   Social History   Socioeconomic History   Marital status: Married    Spouse name: Not on file   Number of children: Not on file   Years of education: Not on file   Highest education level: Not on file  Occupational History   Not on file  Tobacco Use   Smoking status: Never   Smokeless tobacco: Never  Vaping Use   Vaping Use: Never used  Substance and Sexual Activity   Alcohol use: No   Drug use: No   Sexual activity: Not on file  Other Topics Concern   Not on file  Social History Narrative   Not on file   Social Determinants of Health   Financial Resource Strain: Not on file  Food Insecurity: Not on file  Transportation Needs: Not on file  Physical Activity: Not on file  Stress: Not on file  Social Connections: Not on file    Review of Systems  Constitutional: Negative.   Musculoskeletal:  Positive for arthralgias and back pain.   Objective:  BP (!) 142/78   Pulse 76   Temp 98.7 F (37.1  C)   Ht '5\' 7"'  (1.702 m)   Wt 172 lb (78 kg)   SpO2 96%   BMI 26.94 kg/m      05/23/2022    2:20 PM 05/22/2022   10:57 AM 05/22/2022   10:56 AM  BP/Weight  Systolic BP 977  414  Diastolic BP 78  76  Wt. (Lbs) 172 170   BMI 26.94 kg/m2 26.63 kg/m2     Physical Exam Vitals and nursing note reviewed.  Constitutional:      General: He is not in acute distress.    Appearance: Normal appearance.  HENT:     Head: Normocephalic and atraumatic.  Eyes:     General:        Right eye: No discharge.        Left eye: No discharge.     Conjunctiva/sclera: Conjunctivae normal.  Cardiovascular:     Rate and Rhythm: Normal rate and regular rhythm.  Pulmonary:     Effort: Pulmonary effort is normal.     Breath sounds: Normal breath sounds. No wheezing or rales.  Musculoskeletal:        General: No swelling or tenderness.  Neurological:     Mental Status: He is alert.     Lab Results  Component Value Date   WBC 7.4 03/26/2020   HGB  14.6 03/26/2020   HCT 46.5 03/26/2020   PLT 247 03/26/2020   GLUCOSE 127 (H) 09/09/2021   CHOL 236 (H) 06/16/2020   TRIG 212 (H) 06/16/2020   HDL 34 (L) 06/16/2020   LDLCALC 163 (H) 06/16/2020   ALT 24 09/09/2021   AST 23 09/09/2021   NA 141 09/09/2021   K 4.3 09/09/2021   CL 105 09/09/2021   CREATININE 1.21 09/09/2021   BUN 16 09/09/2021   CO2 18 (L) 09/09/2021   PSA 1.0 11/22/2014   HGBA1C 6.5 (A) 06/16/2020   MICROALBUR 1.98 (H) 07/16/2013     Assessment & Plan:   Problem List Items Addressed This Visit       Endocrine   Diabetes ([place])    Unsure of control. Labs today. Continue Glipizide.      Relevant Orders   CMP14+EGFR   Hemoglobin A1c   Microalbumin / creatinine urine ratio     Other   Arthralgia - Primary    Uncertain etiology at this time. Further work up with labs today. Mobic as directed.       Relevant Orders   CBC   Sedimentation Rate   ANA   Cyclic Citrul Peptide Antibody, IGG   Rheumatoid factor    Elevated BP without diagnosis of hypertension   Mixed hyperlipidemia    Stopping Statin as this may be contributing to joint pain. Lipid panel today.      Relevant Orders   Lipid panel   Other Visit Diagnoses     Screening PSA (prostate specific antigen)       Relevant Orders   PSA       Meds ordered this encounter  Medications   meloxicam (MOBIC) 15 MG tablet    Sig: Take 1 tablet (15 mg total) by mouth daily. Take with food.    Dispense:  30 tablet    Refill:  3    Follow-up:  Return in about 3 months (around 08/23/2022).  [place]

## 2022-05-23 NOTE — Patient Instructions (Signed)
Labs today.  Stop the cholesterol medication (Pravastatin).  Medication as directed.  Follow up in 3 months.

## 2022-05-23 NOTE — Assessment & Plan Note (Signed)
Unsure of control. Labs today. Continue Glipizide.

## 2022-05-23 NOTE — Assessment & Plan Note (Signed)
Stopping Statin as this may be contributing to joint pain. Lipid panel today.

## 2022-05-24 LAB — CMP14+EGFR
ALT: 27 IU/L (ref 0–44)
AST: 27 IU/L (ref 0–40)
Albumin/Globulin Ratio: 1.6 (ref 1.2–2.2)
Albumin: 4.6 g/dL (ref 3.9–4.9)
Alkaline Phosphatase: 120 IU/L (ref 44–121)
BUN/Creatinine Ratio: 12 (ref 10–24)
BUN: 16 mg/dL (ref 8–27)
Bilirubin Total: 0.3 mg/dL (ref 0.0–1.2)
CO2: 22 mmol/L (ref 20–29)
Calcium: 9.7 mg/dL (ref 8.6–10.2)
Chloride: 100 mmol/L (ref 96–106)
Creatinine, Ser: 1.31 mg/dL — ABNORMAL HIGH (ref 0.76–1.27)
Globulin, Total: 2.8 g/dL (ref 1.5–4.5)
Glucose: 217 mg/dL — ABNORMAL HIGH (ref 70–99)
Potassium: 3.8 mmol/L (ref 3.5–5.2)
Sodium: 137 mmol/L (ref 134–144)
Total Protein: 7.4 g/dL (ref 6.0–8.5)
eGFR: 61 mL/min/{1.73_m2} (ref >59)

## 2022-05-24 LAB — LIPID PANEL
Chol/HDL Ratio: 6.8 ratio — ABNORMAL HIGH (ref 0.0–5.0)
Cholesterol, Total: 232 mg/dL — ABNORMAL HIGH (ref 100–199)
HDL: 34 mg/dL — ABNORMAL LOW (ref >39)
LDL Chol Calc (NIH): 127 mg/dL — ABNORMAL HIGH (ref 0–99)
Triglycerides: 398 mg/dL — ABNORMAL HIGH (ref 0–149)
VLDL Cholesterol Cal: 71 mg/dL — ABNORMAL HIGH (ref 5–40)

## 2022-05-24 LAB — CBC
Hematocrit: 47.1 % (ref 37.5–51.0)
Hemoglobin: 15.3 g/dL (ref 13.0–17.7)
MCH: 25.8 pg — ABNORMAL LOW (ref 26.6–33.0)
MCHC: 32.5 g/dL (ref 31.5–35.7)
MCV: 79 fL (ref 79–97)
Platelets: 226 10*3/uL (ref 150–450)
RBC: 5.94 x10E6/uL — ABNORMAL HIGH (ref 4.14–5.80)
RDW: 13 % (ref 11.6–15.4)
WBC: 5.5 10*3/uL (ref 3.4–10.8)

## 2022-05-24 LAB — RHEUMATOID FACTOR: Rheumatoid fact SerPl-aCnc: <10 IU/mL (ref <14.0)

## 2022-05-24 LAB — SEDIMENTATION RATE: Sed Rate: 21 mm/hr (ref 0–30)

## 2022-05-24 LAB — HEMOGLOBIN A1C
Est. average glucose Bld gHb Est-mCnc: 214 mg/dL
Hgb A1c MFr Bld: 9.1 % — ABNORMAL HIGH (ref 4.8–5.6)

## 2022-05-24 LAB — ANA: Anti Nuclear Antibody (ANA): NEGATIVE

## 2022-05-24 LAB — PSA: Prostate Specific Ag, Serum: 1 ng/mL (ref 0.0–4.0)

## 2022-05-31 ENCOUNTER — Ambulatory Visit (INDEPENDENT_AMBULATORY_CARE_PROVIDER_SITE_OTHER): Payer: BC Managed Care – PPO | Admitting: Family Medicine

## 2022-05-31 ENCOUNTER — Ambulatory Visit: Payer: BC Managed Care – PPO | Admitting: Family Medicine

## 2022-05-31 ENCOUNTER — Encounter: Payer: Self-pay | Admitting: Family Medicine

## 2022-05-31 DIAGNOSIS — E119 Type 2 diabetes mellitus without complications: Secondary | ICD-10-CM

## 2022-05-31 MED ORDER — METFORMIN HCL 500 MG PO TABS: 500.0000 mg | ORAL_TABLET | Freq: Two times a day (BID) | ORAL | 3 refills | Status: DC

## 2022-05-31 NOTE — Assessment & Plan Note (Signed)
Uncontrolled/worsening.  Adding metformin.  Continue glipizide.

## 2022-05-31 NOTE — Progress Notes (Signed)
Subjective:  Patient ID: Rodney Meyers, male    DOB: 1955/11/30  Age: 66 y.o. MRN: [id number]  CC: Chief Complaint  Patient presents with   Discuss Labs    Pt recent blood work elevated. A!C 9.1    HPI:  65 year old male presents for follow-up regarding diabetes.  Labs recently obtained and revealed an A1c that was elevated at 9.1.  He presents today to discuss treatment options.  He is currently on glipizide.  He is not on any other pharmacotherapy.  He states that he has been on metformin in the past.  Unsure why this was discontinued.  He denies any side effects from use of metformin.  Patient states that his arthralgias have improved slightly.  He is currently not on statin to see if this impacts his neurologist.  Patient Active Problem List   Diagnosis Date Noted   GERD (gastroesophageal reflux disease) 05/23/2022   Arthralgia 05/23/2022   Mixed hyperlipidemia 07/24/2013   Diabetes (HCC) 07/13/2013   Asthma, chronic 12/16/2012    Social Hx   Social History   Socioeconomic History   Marital status: Married    Spouse name: Not on file   Number of children: Not on file   Years of education: Not on file   Highest education level: Not on file  Occupational History   Not on file  Tobacco Use   Smoking status: Never   Smokeless tobacco: Never  Vaping Use   Vaping Use: Never used  Substance and Sexual Activity   Alcohol use: No   Drug use: No   Sexual activity: Not on file  Other Topics Concern   Not on file  Social History Narrative   Not on file   Social Determinants of Health   Financial Resource Strain: Not on file  Food Insecurity: Not on file  Transportation Needs: Not on file  Physical Activity: Not on file  Stress: Not on file  Social Connections: Not on file    Review of Systems Per HPI  Objective:  BP 122/80   Pulse (!) 59   Temp (!) 97.4 F (36.3 C)   Wt 171 lb 9.6 oz (77.8 kg)   SpO2 96%   BMI 26.88 kg/m      05/31/2022    10:50 AM 05/23/2022    2:20 PM 05/22/2022   10:57 AM  BP/Weight  Systolic BP 122 142   Diastolic BP 80 78   Wt. (Lbs) 171.6 172 170  BMI 26.88 kg/m2 26.94 kg/m2 26.63 kg/m2    Physical Exam Vitals and nursing note reviewed.  Constitutional:      General: He is not in acute distress.    Appearance: Normal appearance.  HENT:     Head: Normocephalic and atraumatic.  Cardiovascular:     Rate and Rhythm: Normal rate and regular rhythm.  Pulmonary:     Effort: Pulmonary effort is normal.     Breath sounds: Normal breath sounds. No wheezing, rhonchi or rales.  Neurological:     Mental Status: He is alert.  Psychiatric:        Mood and Affect: Mood normal.        Behavior: Behavior normal.     Lab Results  Component Value Date   WBC 5.5 05/23/2022   HGB 15.3 05/23/2022   HCT 47.1 05/23/2022   PLT 226 05/23/2022   GLUCOSE 217 (H) 05/23/2022   CHOL 232 (H) 05/23/2022   TRIG 398 (H) 05/23/2022   HDL 34 (L)  05/23/2022   LDLCALC 127 (H) 05/23/2022   ALT 27 05/23/2022   AST 27 05/23/2022   NA 137 05/23/2022   K 3.8 05/23/2022   CL 100 05/23/2022   CREATININE 1.31 (H) 05/23/2022   BUN 16 05/23/2022   CO2 22 05/23/2022   PSA 1.0 11/22/2014   HGBA1C 9.1 (H) 05/23/2022   MICROALBUR 1.98 (H) 07/16/2013     Assessment & Plan:   Problem List Items Addressed This Visit       Endocrine   Diabetes ([place])    Uncontrolled/worsening.  Adding metformin.  Continue glipizide.      Relevant Medications   metFORMIN (GLUCOPHAGE) 500 MG tablet    Meds ordered this encounter  Medications   metFORMIN (GLUCOPHAGE) 500 MG tablet    Sig: Take 1 tablet (500 mg total) by mouth 2 (two) times daily with a meal.    Dispense:  180 tablet    Refill:  3    Follow-up:  Return in about 3 months (around 08/31/2022).  [place]

## 2022-05-31 NOTE — Patient Instructions (Addendum)
Continue Glipizide.  Start Metformin.  Follow up in 3 months.  Take care  Dr. Lacinda Axon

## 2022-06-16 ENCOUNTER — Other Ambulatory Visit: Payer: Self-pay | Admitting: Family Medicine

## 2022-06-16 NOTE — Telephone Encounter (Signed)
Unable to refill per protocol, last refill by another provider.  Will refuse.  Requested Prescriptions  Pending Prescriptions Disp Refills   albuterol (VENTOLIN HFA) 108 (90 Base) MCG/ACT inhaler [Pharmacy Med Name: ALBUTEROL HFA (PROAIR) INHALER] 8.5 each     Sig: INHALE 2 PUFFS INTO THE LUNGS EVERY 4 HOURS AS NEEDED FOR WHEEZING OR SHORTNESS OF BREATH.     There is no refill protocol information for this order

## 2022-08-13 ENCOUNTER — Other Ambulatory Visit: Payer: Self-pay | Admitting: Family Medicine

## 2022-08-17 ENCOUNTER — Other Ambulatory Visit: Payer: Self-pay | Admitting: Family Medicine

## 2022-08-17 NOTE — Telephone Encounter (Signed)
Urgent Care Patient Requested Prescriptions  Pending Prescriptions Disp Refills   fluticasone (FLONASE) 50 MCG/ACT nasal spray [Pharmacy Med Name: FLUTICASONE PROP 50 MCG SPRAY] 48 mL     Sig: PLACE 1 SPRAY INTO BOTH NOSTRILS 2 (TWO) TIMES DAILY     There is no refill protocol information for this order

## 2022-08-21 ENCOUNTER — Other Ambulatory Visit: Payer: Self-pay | Admitting: Family Medicine

## 2022-08-22 NOTE — Telephone Encounter (Signed)
Unable to refill per protocol, last refill by another provider not at this practice. Will refuse.  Requested Prescriptions  Pending Prescriptions Disp Refills   albuterol (VENTOLIN HFA) 108 (90 Base) MCG/ACT inhaler [Pharmacy Med Name: ALBUTEROL HFA (PROAIR) INHALER] 8.5 each     Sig: INHALE 2 PUFFS INTO THE LUNGS EVERY 4 HOURS AS NEEDED FOR WHEEZING OR SHORTNESS OF BREATH.     There is no refill protocol information for this order      

## 2022-08-23 ENCOUNTER — Ambulatory Visit: Payer: BC Managed Care – PPO | Admitting: Family Medicine

## 2022-08-26 ENCOUNTER — Other Ambulatory Visit: Payer: Self-pay | Admitting: Family Medicine

## 2022-09-03 ENCOUNTER — Other Ambulatory Visit: Payer: Self-pay | Admitting: Family Medicine

## 2022-09-05 NOTE — Telephone Encounter (Signed)
Unable to refill per protocol, last refill by another provider not at this practice. Will refuse.  Requested Prescriptions  Pending Prescriptions Disp Refills   albuterol (VENTOLIN HFA) 108 (90 Base) MCG/ACT inhaler [Pharmacy Med Name: ALBUTEROL HFA (PROAIR) INHALER] 8.5 each     Sig: INHALE 2 PUFFS INTO THE LUNGS EVERY 4 HOURS AS NEEDED FOR WHEEZING OR SHORTNESS OF BREATH.     There is no refill protocol information for this order

## 2022-09-20 ENCOUNTER — Encounter: Payer: Self-pay | Admitting: Family Medicine

## 2022-09-20 ENCOUNTER — Ambulatory Visit: Payer: BC Managed Care – PPO | Admitting: Family Medicine

## 2022-09-20 ENCOUNTER — Encounter (INDEPENDENT_AMBULATORY_CARE_PROVIDER_SITE_OTHER): Payer: Self-pay | Admitting: *Deleted

## 2022-09-20 DIAGNOSIS — E119 Type 2 diabetes mellitus without complications: Secondary | ICD-10-CM | POA: Diagnosis not present

## 2022-09-20 DIAGNOSIS — Z1211 Encounter for screening for malignant neoplasm of colon: Secondary | ICD-10-CM

## 2022-09-20 DIAGNOSIS — J45909 Unspecified asthma, uncomplicated: Secondary | ICD-10-CM

## 2022-09-20 DIAGNOSIS — K219 Gastro-esophageal reflux disease without esophagitis: Secondary | ICD-10-CM

## 2022-09-20 MED ORDER — ALBUTEROL SULFATE HFA 108 (90 BASE) MCG/ACT IN AERS: 2.0000 | INHALATION_SPRAY | RESPIRATORY_TRACT | 2 refills | Status: AC | PRN

## 2022-09-20 MED ORDER — PANTOPRAZOLE SODIUM 40 MG PO TBEC: 40.0000 mg | DELAYED_RELEASE_TABLET | Freq: Every day | ORAL | 3 refills | Status: DC

## 2022-09-20 NOTE — Progress Notes (Signed)
Subjective:  Patient ID: Rodney Meyers, male    DOB: 09/26/1955  Age: 67 y.o. MRN: [id number]  CC: Chief Complaint  Patient presents with   Diabetes   Gastroesophageal Reflux    HPI:  67 year old male with asthma, GERD, type 2 diabetes, hyperlipidemia presents for follow-up.  Patient states that he is tolerating metformin well.  This was recently added.  He is on glipizide as well.  No reports of hypoglycemia.  Needs A1c.  Needs foot exam today.  Asthma stable.  Needs refill on his albuterol inhaler.  GERD is stable.  Needs refill on Protonix.  Patient is due for colonoscopy.  He would like referral to be placed.  Patient Active Problem List   Diagnosis Date Noted   GERD (gastroesophageal reflux disease) 05/23/2022   Arthralgia 05/23/2022   Mixed hyperlipidemia 07/24/2013   Diabetes ([place]) 07/13/2013   Asthma, chronic 12/16/2012    Social Hx   Social History   Socioeconomic History   Marital status: Married    Spouse name: Not on file   Number of children: Not on file   Years of education: Not on file   Highest education level: Not on file  Occupational History   Not on file  Tobacco Use   Smoking status: Never   Smokeless tobacco: Never  Vaping Use   Vaping Use: Never used  Substance and Sexual Activity   Alcohol use: No   Drug use: No   Sexual activity: Not on file  Other Topics Concern   Not on file  Social History Narrative   Not on file   Social Determinants of Health   Financial Resource Strain: Not on file  Food Insecurity: Not on file  Transportation Needs: Not on file  Physical Activity: Not on file  Stress: Not on file  Social Connections: Not on file    Review of Systems  Constitutional: Negative.   Respiratory: Negative.    Cardiovascular: Negative.    Objective:  BP 128/71   Pulse (!) 57   Ht 5\' 7"  (1.702 m)   Wt 170 lb (77.1 kg)   SpO2 97%   BMI 26.63 kg/m      09/20/2022    8:39 AM 05/31/2022   10:50 AM 05/23/2022     2:20 PM  BP/Weight  Systolic BP 379 024 097  Diastolic BP 71 80 78  Wt. (Lbs) 170 171.6 172  BMI 26.63 kg/m2 26.88 kg/m2 26.94 kg/m2    Physical Exam Constitutional:      General: He is not in acute distress.    Appearance: Normal appearance.  HENT:     Head: Normocephalic and atraumatic.  Eyes:     General:        Right eye: No discharge.        Left eye: No discharge.     Conjunctiva/sclera: Conjunctivae normal.  Cardiovascular:     Rate and Rhythm: Normal rate and regular rhythm.  Pulmonary:     Effort: Pulmonary effort is normal.     Breath sounds: Normal breath sounds. No wheezing, rhonchi or rales.  Neurological:     Mental Status: He is alert.  Psychiatric:        Mood and Affect: Mood normal.        Behavior: Behavior normal.   Diabetic Foot Check -  Appearance - no lesions, ulcers or calluses Skin - no unusual pallor or redness Monofilament testing -  Right - Great toe, medial, central, lateral ball and  posterior foot intact Left - Great toe, medial, central, lateral ball and posterior foot intact   Lab Results  Component Value Date   WBC 5.5 05/23/2022   HGB 15.3 05/23/2022   HCT 47.1 05/23/2022   PLT 226 05/23/2022   GLUCOSE 217 (H) 05/23/2022   CHOL 232 (H) 05/23/2022   TRIG 398 (H) 05/23/2022   HDL 34 (L) 05/23/2022   LDLCALC 127 (H) 05/23/2022   ALT 27 05/23/2022   AST 27 05/23/2022   NA 137 05/23/2022   K 3.8 05/23/2022   CL 100 05/23/2022   CREATININE 1.31 (H) 05/23/2022   BUN 16 05/23/2022   CO2 22 05/23/2022   PSA 1.0 11/22/2014   HGBA1C 9.1 (H) 05/23/2022   MICROALBUR 1.98 (H) 07/16/2013     Assessment & Plan:   Problem List Items Addressed This Visit       Respiratory   Asthma, chronic    Stable.  Continue albuterol.  Refilled today.      Relevant Medications   albuterol (VENTOLIN HFA) 108 (90 Base) MCG/ACT inhaler     Digestive   GERD (gastroesophageal reflux disease)    Stable on Protonix.  Continue.  Refilled  today.      Relevant Medications   pantoprazole (PROTONIX) 40 MG tablet     Endocrine   Diabetes ([place])    A1c today to reassess.  Continue metformin and glipizide.  Foot exam performed.  Sending for labs including urine microalbumin.  Patient advised to get eye exam.      Relevant Orders   Basic metabolic panel   Microalbumin / creatinine urine ratio   Hemoglobin A1c   Other Visit Diagnoses     Encounter for screening colonoscopy       Relevant Orders   Ambulatory referral to Gastroenterology       Meds ordered this encounter  Medications   albuterol (VENTOLIN HFA) 108 (90 Base) MCG/ACT inhaler    Sig: Inhale 2 puffs into the lungs every 4 (four) hours as needed for wheezing or shortness of breath.    Dispense:  18 g    Refill:  2   pantoprazole (PROTONIX) 40 MG tablet    Sig: Take 1 tablet (40 mg total) by mouth daily.    Dispense:  90 tablet    Refill:  3    Follow-up:  Return in about 3 months (around 12/19/2022) for Diabetes follow up.  [place]

## 2022-09-20 NOTE — Assessment & Plan Note (Signed)
Stable.  Continue albuterol.  Refilled today.

## 2022-09-20 NOTE — Assessment & Plan Note (Signed)
A1c today to reassess.  Continue metformin and glipizide.  Foot exam performed.  Sending for labs including urine microalbumin.  Patient advised to get eye exam.

## 2022-09-20 NOTE — Patient Instructions (Signed)
Labs today.  I have refilled the medications and placed a referral for colonoscopy.  Follow-up in 3 months

## 2022-09-20 NOTE — Assessment & Plan Note (Signed)
Stable on Protonix.  Continue.  Refilled today.

## 2022-12-12 ENCOUNTER — Encounter (INDEPENDENT_AMBULATORY_CARE_PROVIDER_SITE_OTHER): Payer: Self-pay | Admitting: Gastroenterology

## 2022-12-12 ENCOUNTER — Ambulatory Visit (INDEPENDENT_AMBULATORY_CARE_PROVIDER_SITE_OTHER): Payer: BC Managed Care – PPO | Admitting: Gastroenterology

## 2022-12-19 ENCOUNTER — Encounter: Payer: Self-pay | Admitting: Family Medicine

## 2022-12-19 ENCOUNTER — Ambulatory Visit: Payer: BC Managed Care – PPO | Admitting: Family Medicine

## 2023-01-26 ENCOUNTER — Encounter: Payer: Self-pay | Admitting: Emergency Medicine

## 2023-01-26 ENCOUNTER — Ambulatory Visit
Admission: EM | Admit: 2023-01-26 | Discharge: 2023-01-26 | Disposition: A | Discharge status: Home / Self Care | Payer: BC Managed Care – PPO | Attending: Nurse Practitioner | Admitting: Nurse Practitioner

## 2023-01-26 DIAGNOSIS — Z87442 Personal history of urinary calculi: Secondary | ICD-10-CM | POA: Diagnosis not present

## 2023-01-26 DIAGNOSIS — R109 Unspecified abdominal pain: Secondary | ICD-10-CM | POA: Insufficient documentation

## 2023-01-26 HISTORY — DX: Calculus of kidney: N20.0

## 2023-01-26 LAB — POCT URINALYSIS DIP (MANUAL ENTRY)
Bilirubin, UA: NEGATIVE
Glucose, UA: 500 mg/dL — AB
Leukocytes, UA: NEGATIVE
Nitrite, UA: NEGATIVE
Protein Ur, POC: 100 mg/dL — AB
Spec Grav, UA: 1.025 (ref 1.010–1.025)
Urobilinogen, UA: 0.2 E.U./dL
pH, UA: 5.5 (ref 5.0–8.0)

## 2023-01-26 MED ORDER — ONDANSETRON 4 MG PO TBDP: 4.0000 mg | ORAL_TABLET | Freq: Three times a day (TID) | ORAL | 0 refills | Status: AC | PRN

## 2023-01-26 MED ORDER — KETOROLAC TROMETHAMINE 30 MG/ML IJ SOLN
15.0000 mg | Freq: Once | INTRAMUSCULAR | Status: AC
  Administered 2023-01-26: 15 mg via INTRAMUSCULAR

## 2023-01-26 MED ORDER — ONDANSETRON 4 MG PO TBDP
4.0000 mg | ORAL_TABLET | Freq: Once | ORAL | Status: AC
  Administered 2023-01-26: 4 mg via ORAL

## 2023-01-26 MED ORDER — TRAMADOL HCL 50 MG PO TABS: 50.0000 mg | ORAL_TABLET | Freq: Two times a day (BID) | ORAL | 0 refills | Status: DC | PRN

## 2023-01-26 MED ORDER — TAMSULOSIN HCL 0.4 MG PO CAPS: 0.4000 mg | ORAL_CAPSULE | Freq: Every day | ORAL | 0 refills | Status: DC

## 2023-01-26 NOTE — ED Triage Notes (Signed)
Right flank pain since last night. History of kidney stones.  Feels nauseated sine this morning.

## 2023-01-26 NOTE — Discharge Instructions (Addendum)
The urinalysis suggest that you do have a kidney stone.  A urine culture is pending.  You will be contacted if the pending test result is abnormal. Take medication as prescribed.  You have been provided a prescription for pain medication.  Do not drive or operate heavy machinery or drink alcohol when taking the medication. Drink plenty of fluids.  Recommend drinking at least 8-10 8 ounce glasses of water while symptoms persist. May take over-the-counter Tylenol arthritis strength 650 mg tablets as needed for flank pain. You have been provided a strainer to strain your urine each time you have to urinate to see if you pass a kidney stone. Follow-up with [facility] health urology as soon as possible.  Please call to make an appointment.  Also recommend following up with your primary care office within the next 7 to 10 days. Go to the ER if you have any new or worsening symptoms (difficulty urinating, blood in urine, pain that does not moderate with medication, fever, chills, abdominal pain, etc...)

## 2023-01-26 NOTE — ED Provider Notes (Signed)
[facility]    CSN: [id number] Arrival date & time: 01/26/23  0801      History   Chief Complaint No chief complaint on file.   HPI Rodney Meyers is a 67 y.o. male.   The history is provided by the patient.   Patient presents for complaints of right flank pain with nausea and abdominal pain that started early this morning.  Patient states his abdominal pain feels like "burning" in his stomach.  He reports a prior history of kidney stones.  He complains of flank pain.  Patient denies fever, chills, dysuria, urinary frequency, urgency, hesitancy, hematuria, decreased urine stream, vomiting, or diarrhea.  Patient reports prior history of kidney stones.  States symptoms feel the same.  He has not taken any medications for his symptoms.  Patient reports he has not seen urology for his symptoms in the past.  Past Medical History:  Diagnosis Date   Asthma    Diabetes mellitus without complication (HCC)    Kidney stone     Patient Active Problem List   Diagnosis Date Noted   GERD (gastroesophageal reflux disease) 05/23/2022   Arthralgia 05/23/2022   Mixed hyperlipidemia 07/24/2013   Diabetes (HCC) 07/13/2013   Asthma, chronic 12/16/2012    Past Surgical History:  Procedure Laterality Date   L. arm surgery         Home Medications    Prior to Admission medications   Medication Sig Start Date End Date Taking? Authorizing Provider  ondansetron (ZOFRAN-ODT) 4 MG disintegrating tablet Take 1 tablet (4 mg total) by mouth every 8 (eight) hours as needed. 01/26/23  Yes Tala Eber-Warren, Sadie Haber, NP  tamsulosin (FLOMAX) 0.4 MG CAPS capsule Take 1 capsule (0.4 mg total) by mouth daily after supper. 01/26/23  Yes Shandie Bertz-Warren, Sadie Haber, NP  traMADol (ULTRAM) 50 MG tablet Take 1 tablet (50 mg total) by mouth every 12 (twelve) hours as needed. 01/26/23  Yes Shreya Lacasse-Warren, Sadie Haber, NP  albuterol (VENTOLIN HFA) 108 (90 Base) MCG/ACT inhaler Inhale 2 puffs into the lungs  every 4 (four) hours as needed for wheezing or shortness of breath. 09/20/22   Everlene Other G, DO  ipratropium (ATROVENT) 0.03 % nasal spray Place 2 sprays into both nostrils 2 (two) times daily. 09/09/21   Wallis Bamberg, PA-C  metFORMIN (GLUCOPHAGE) 500 MG tablet Take 1 tablet (500 mg total) by mouth 2 (two) times daily with a meal. 05/31/22   Cook, Dorie Rank G, DO  pantoprazole (PROTONIX) 40 MG tablet Take 1 tablet (40 mg total) by mouth daily. 09/20/22   Tommie Sams, DO  polyethylene glycol powder (MIRALAX) 17 GM/SCOOP powder Take 1 scoop (17 Gm) by mouth daily for constipation.  May mix with water, juice or milk. 03/23/20   Durward Parcel, FNP    Family History Family History  Problem Relation Age of Onset   Heart attack Father     Social History Social History   Tobacco Use   Smoking status: Never   Smokeless tobacco: Never  Vaping Use   Vaping Use: Never used  Substance Use Topics   Alcohol use: No   Drug use: No     Allergies   Patient has no known allergies.   Review of Systems Review of Systems Per HPI  Physical Exam Triage Vital Signs ED Triage Vitals  Enc Vitals Group     BP 01/26/23 0812 (!) 181/81     Pulse Rate 01/26/23 0812 (!) 57     Resp  01/26/23 0812 18     Temp 01/26/23 0812 97.9 F (36.6 C)     Temp Source 01/26/23 0812 Oral     SpO2 01/26/23 0812 94 %     Weight --      Height --      Head Circumference --      Peak Flow --      Pain Score 01/26/23 0813 8     Pain Loc --      Pain Edu? --      Excl. in GC? --    No data found.  Updated Vital Signs BP (!) 181/81 (BP Location: Right Arm)   Pulse (!) 57   Temp 97.9 F (36.6 C) (Oral)   Resp 18   SpO2 94%   Visual Acuity Right Eye Distance:   Left Eye Distance:   Bilateral Distance:    Right Eye Near:   Left Eye Near:    Bilateral Near:     Physical Exam Vitals and nursing note reviewed.  Constitutional:      General: He is in acute distress (Appears uncomfortable due to right  flank pain).     Appearance: Normal appearance.  HENT:     Head: Normocephalic.  Eyes:     Extraocular Movements: Extraocular movements intact.     Pupils: Pupils are equal, round, and reactive to light.  Cardiovascular:     Rate and Rhythm: Regular rhythm. Bradycardia present.     Pulses: Normal pulses.     Heart sounds: Normal heart sounds.  Pulmonary:     Effort: Pulmonary effort is normal. No respiratory distress.     Breath sounds: Normal breath sounds. No stridor. No wheezing, rhonchi or rales.  Abdominal:     General: Bowel sounds are normal. There is no distension.     Palpations: Abdomen is soft.     Tenderness: There is generalized abdominal tenderness. There is no right CVA tenderness or left CVA tenderness.  Musculoskeletal:     Cervical back: Normal range of motion.  Skin:    General: Skin is warm and dry.  Neurological:     General: No focal deficit present.     Mental Status: He is alert and oriented to person, place, and time.  Psychiatric:        Mood and Affect: Mood normal.        Behavior: Behavior normal.      UC Treatments / Results  Labs (all labs ordered are listed, but only abnormal results are displayed) Labs Reviewed  POCT URINALYSIS DIP (MANUAL ENTRY) - Abnormal; Notable for the following components:      Result Value   Glucose, UA =500 (*)    Ketones, POC UA trace (5) (*)    Blood, UA moderate (*)    Protein Ur, POC =100 (*)    All other components within normal limits  URINE CULTURE    EKG   Radiology No results found.  Procedures Procedures (including critical care time)  Medications Ordered in UC Medications  ondansetron (ZOFRAN-ODT) disintegrating tablet 4 mg (4 mg Oral Given 01/26/23 0811)  ketorolac (TORADOL) 30 MG/ML injection 15 mg (15 mg Intramuscular Given 01/26/23 0837)    Initial Impression / Assessment and Plan / UC Course  I have reviewed the triage vital signs and the nursing notes.  Pertinent labs & imaging  results that were available during my care of the patient were reviewed by me and considered in my medical decision making (see  chart for details).  Patient presents for complaints of right flank pain that started earlier this morning.  Patient's blood pressure is elevated, he is in mild distress due to right flank pain.  Urinalysis is positive for blood, ketones, protein, and glucose, urine culture is pending.  Patient was given Toradol 15 mg IM for right flank pain (most recent available creatinine done in October 23, was 1.3) and Zofran 4 mg ODT for nausea in the office.  Symptoms appear to be consistent with nephrolithiasis, however cannot confirm without proper imaging, which is unavailable in this location.  Patient was provided prescriptions for tamsulosin 0.4 mg, Zofran 4 mg for nausea, and tramadol 50 mg for pain.  Patient was given supportive care recommendations to include increasing his fluid intake, did a urine strainer, and given strict ER follow-up precautions.  Patient was also given information to follow-up with [facility] health urology.  Patient is in agreement with this plan of care and verbalizes understanding.  All questions were answered.  Patient stable for discharge.   Final Clinical Impressions(s) / UC Diagnoses   Final diagnoses:  Right flank pain  History of kidney stones     Discharge Instructions      The urinalysis suggest that you do have a kidney stone.  A urine culture is pending.  You will be contacted if the pending test result is abnormal. Take medication as prescribed.  You have been provided a prescription for pain medication.  Do not drive or operate heavy machinery or drink alcohol when taking the medication. Drink plenty of fluids.  Recommend drinking at least 8-10 8 ounce glasses of water while symptoms persist. May take over-the-counter Tylenol arthritis strength 650 mg tablets as needed for flank pain. You have been provided a strainer to strain your urine  each time you have to urinate to see if you pass a kidney stone. Follow-up with [facility] health urology as soon as possible.  Please call to make an appointment.  Also recommend following up with your primary care office within the next 7 to 10 days. Go to the ER if you have any new or worsening symptoms (difficulty urinating, blood in urine, pain that does not moderate with medication, fever, chills, abdominal pain, etc...)      ED Prescriptions     Medication Sig Dispense Auth. Provider   tamsulosin (FLOMAX) 0.4 MG CAPS capsule Take 1 capsule (0.4 mg total) by mouth daily after supper. 30 capsule Makynlee Kressin-Warren, Sadie Haber, NP   ondansetron (ZOFRAN-ODT) 4 MG disintegrating tablet Take 1 tablet (4 mg total) by mouth every 8 (eight) hours as needed. 20 tablet Maliik Karner-Warren, Sadie Haber, NP   traMADol (ULTRAM) 50 MG tablet Take 1 tablet (50 mg total) by mouth every 12 (twelve) hours as needed. 10 tablet Joren Rehm-Warren, Sadie Haber, NP      PDMP not reviewed this encounter.   Abran Cantor, NP 01/26/23 [phone]

## 2023-01-27 LAB — URINE CULTURE

## 2023-01-28 LAB — URINE CULTURE: Culture: 9000 — AB

## 2023-01-30 ENCOUNTER — Telehealth (HOSPITAL_COMMUNITY): Payer: Self-pay | Admitting: Emergency Medicine

## 2023-01-30 MED ORDER — NITROFURANTOIN MONOHYD MACRO 100 MG PO CAPS: 100.0000 mg | ORAL_CAPSULE | Freq: Two times a day (BID) | ORAL | 0 refills | Status: DC

## 2023-01-31 ENCOUNTER — Other Ambulatory Visit: Payer: Self-pay

## 2023-01-31 ENCOUNTER — Ambulatory Visit (HOSPITAL_COMMUNITY)
Admission: RE | Admit: 2023-01-31 | Discharge: 2023-01-31 | Disposition: A | Discharge status: Home / Self Care | Payer: BC Managed Care – PPO | Source: Ambulatory Visit | Attending: Urology | Admitting: Urology

## 2023-01-31 DIAGNOSIS — N200 Calculus of kidney: Secondary | ICD-10-CM | POA: Diagnosis not present

## 2023-02-01 ENCOUNTER — Ambulatory Visit (HOSPITAL_COMMUNITY)
Admission: RE | Admit: 2023-02-01 | Discharge: 2023-02-01 | Disposition: A | Discharge status: Home / Self Care | Payer: BC Managed Care – PPO | Source: Ambulatory Visit | Attending: Urology | Admitting: Urology

## 2023-02-01 ENCOUNTER — Ambulatory Visit (INDEPENDENT_AMBULATORY_CARE_PROVIDER_SITE_OTHER): Payer: BC Managed Care – PPO | Admitting: Urology

## 2023-02-01 ENCOUNTER — Encounter: Payer: Self-pay | Admitting: Urology

## 2023-02-01 VITALS — BP 130/74 | HR 63 | Ht 67.0 in | Wt 170.0 lb

## 2023-02-01 DIAGNOSIS — N210 Calculus in bladder: Secondary | ICD-10-CM | POA: Diagnosis not present

## 2023-02-01 DIAGNOSIS — N200 Calculus of kidney: Secondary | ICD-10-CM | POA: Insufficient documentation

## 2023-02-01 DIAGNOSIS — K760 Fatty (change of) liver, not elsewhere classified: Secondary | ICD-10-CM | POA: Diagnosis not present

## 2023-02-01 LAB — URINALYSIS, ROUTINE W REFLEX MICROSCOPIC
Bilirubin, UA: NEGATIVE
Ketones, UA: NEGATIVE
Leukocytes,UA: NEGATIVE
Nitrite, UA: NEGATIVE
Protein,UA: NEGATIVE
RBC, UA: NEGATIVE
Specific Gravity, UA: 1.03 (ref 1.005–1.030)
Urobilinogen, Ur: 0.2 mg/dL (ref 0.2–1.0)
pH, UA: 5.5 (ref 5.0–7.5)

## 2023-02-01 MED ORDER — TAMSULOSIN HCL 0.4 MG PO CAPS
0.4000 mg | ORAL_CAPSULE | Freq: Every day | ORAL | 0 refills | Status: DC
Start: 2023-02-01 — End: 2023-09-07

## 2023-02-01 NOTE — Progress Notes (Signed)
02/01/2023 3:01 PM   Rodney Meyers May 30, 1956 [id number]  Referring provider: Tommie Sams, DO [address] [place] Mey[place],  [place] [address]  nephrolithiasis   HPI: Mr Rodney Meyers is a 67yo here for evaluation of nephrolithiasis. He was seen in the urgent care 6/13 with right flank pain and was started on tramadol and flomax. CT from 2021 shows bilateral renal calculi. KUB from today does not show a definitive calculus. No flank pain currently. No worsening LUTS   PMH: Past Medical History:  Diagnosis Date   Asthma    Diabetes mellitus without complication (HCC)    Kidney stone     Surgical History: Past Surgical History:  Procedure Laterality Date   L. arm surgery      Home Medications:  Allergies as of 02/01/2023   No Known Allergies      Medication List        Accurate as of February 01, 2023  3:01 PM. If you have any questions, ask your nurse or doctor.          albuterol 108 (90 Base) MCG/ACT inhaler Commonly known as: VENTOLIN HFA Inhale 2 puffs into the lungs every 4 (four) hours as needed for wheezing or shortness of breath.   ipratropium 0.03 % nasal spray Commonly known as: ATROVENT Place 2 sprays into both nostrils 2 (two) times daily.   metFORMIN 500 MG tablet Commonly known as: GLUCOPHAGE Take 1 tablet (500 mg total) by mouth 2 (two) times daily with a meal.   nitrofurantoin (macrocrystal-monohydrate) 100 MG capsule Commonly known as: MACROBID Take 1 capsule (100 mg total) by mouth 2 (two) times daily.   ondansetron 4 MG disintegrating tablet Commonly known as: ZOFRAN-ODT Take 1 tablet (4 mg total) by mouth every 8 (eight) hours as needed.   pantoprazole 40 MG tablet Commonly known as: PROTONIX Take 1 tablet (40 mg total) by mouth daily.   polyethylene glycol powder 17 GM/SCOOP powder Commonly known as: MiraLax Take 1 scoop (17 Gm) by mouth daily for constipation.  May mix with water, juice or milk.   tamsulosin 0.4 MG Caps  capsule Commonly known as: FLOMAX Take 1 capsule (0.4 mg total) by mouth daily after supper.   traMADol 50 MG tablet Commonly known as: ULTRAM Take 1 tablet (50 mg total) by mouth every 12 (twelve) hours as needed.        Allergies: No Known Allergies  Family History: Family History  Problem Relation Age of Onset   Heart attack Father     Social History:  reports that he has never smoked. He has never used smokeless tobacco. He reports that he does not drink alcohol and does not use drugs.  ROS: All other review of systems were reviewed and are negative except what is noted above in HPI  Physical Exam: BP 130/74   Pulse 63   Ht 5\' 7"  (1.702 m)   Wt 170 lb (77.1 kg)   BMI 26.63 kg/m   Constitutional:  Alert and oriented, No acute distress. HEENT: [place] AT, moist mucus membranes.  Trachea midline, no masses. Cardiovascular: No clubbing, cyanosis, or edema. Respiratory: Normal respiratory effort, no increased work of breathing. GI: Abdomen is soft, nontender, nondistended, no abdominal masses GU: No CVA tenderness.  Lymph: No cervical or inguinal lymphadenopathy. Skin: No rashes, bruises or suspicious lesions. Neurologic: Grossly intact, no focal deficits, moving all 4 extremities. Psychiatric: Normal mood and affect.  Laboratory Data: Lab Results  Component Value Date  WBC 5.5 05/23/2022   HGB 15.3 05/23/2022   HCT 47.1 05/23/2022   MCV 79 05/23/2022   PLT 226 05/23/2022    Lab Results  Component Value Date   CREATININE 1.31 (H) 05/23/2022    Lab Results  Component Value Date   PSA 1.0 11/22/2014   PSA 0.56 07/16/2013    No results found for: "TESTOSTERONE"  Lab Results  Component Value Date   HGBA1C 9.1 (H) 05/23/2022    Urinalysis    Component Value Date/Time   BILIRUBINUR negative 01/26/2023 0821   BILIRUBINUR + 05/09/2017 1432   KETONESUR trace (5) (A) 01/26/2023 0821   PROTEINUR =100 (A) 01/26/2023 0821   PROTEINUR 30 05/09/2017 1432    UROBILINOGEN 0.2 01/26/2023 0821   NITRITE Negative 01/26/2023 0821   LEUKOCYTESUR Negative 01/26/2023 0821    Lab Results  Component Value Date   LABMICR 13.6 06/16/2020    Pertinent Imaging: KUB today: Images reviewed and discussed with the patient  No results found for this or any previous visit.  No results found for this or any previous visit.  No results found for this or any previous visit.  No results found for this or any previous visit.  No results found for this or any previous visit.  No valid procedures specified. No results found for this or any previous visit.  Results for orders placed during the hospital encounter of 01/15/19  CT Renal Stone Study  Narrative CLINICAL DATA:  67 year old male with flank pain. Concern for kidney stone.  EXAM: CT ABDOMEN AND PELVIS WITHOUT CONTRAST  TECHNIQUE: Multidetector CT imaging of the abdomen and pelvis was performed following the standard protocol without IV contrast.  COMPARISON:  CT of the abdomen pelvis dated 05/09/2017  FINDINGS: Evaluation of this exam is limited in the absence of intravenous contrast.  Lower chest: The visualized lung bases are clear.  No intra-abdominal free air or free fluid.  Hepatobiliary: There is diffuse fatty infiltration of the liver. No intrahepatic biliary ductal dilatation. Probable hyper concentrated bile or sludge within the gallbladder. No calcified stone.  Pancreas: Unremarkable. No pancreatic ductal dilatation or surrounding inflammatory changes.  Spleen: Normal in size without focal abnormality.  Adrenals/Urinary Tract: The adrenal glands are unremarkable. There is an obstructing stone in the distal right ureter measuring up to 9 mm in greatest dimension. There is mild right hydronephrosis. Moderate right perinephric stranding extending inferiorly along the right paracolic gutter. Several additional bilateral nonobstructing renal calculi noted measuring up to  5 mm in the inferior pole of the left kidney. There is no hydronephrosis on the left. The left ureter and urinary bladder appear unremarkable.  Stomach/Bowel: There is sigmoid diverticulosis and scattered colonic diverticula without active inflammatory changes. There is no bowel obstruction or active inflammation. The appendix is normal.  Vascular/Lymphatic: The abdominal aorta and IVC appear unremarkable on this noncontrast CT. No portal venous gas. There is no adenopathy.  Reproductive: The prostate and seminal vesicles are grossly unremarkable. No pelvic mass.  Other: None  Musculoskeletal: No acute or significant osseous findings.  IMPRESSION: 1. Obstructing 9 mm distal right ureteral stone with mild right hydronephrosis. 2. Several additional nonobstructing bilateral renal calculi noted. No hydronephrosis on the left. 3. Fatty liver. 4. Colonic diverticulosis. No bowel obstruction or active inflammation. Normal appendix.   Electronically Signed By: Elgie Collard M.D. On: 01/15/2019 02:54   Assessment & Plan:    1. Kidney stones -CT stone study. Continue flomax 0.4mg  daily - Urinalysis, Routine w  reflex microscopic   No follow-ups on file.  Andre Gallego, MD  [facility] [place]   

## 2023-02-09 ENCOUNTER — Encounter: Payer: Self-pay | Admitting: Urology

## 2023-02-09 NOTE — Patient Instructions (Signed)

## 2023-02-14 ENCOUNTER — Telehealth: Payer: Self-pay

## 2023-02-14 NOTE — Telephone Encounter (Signed)
Patients wife's called stating husband in having UTI symptoms. Patient placed on nurse visit schedule tomorrow for urine drop off.

## 2023-02-15 ENCOUNTER — Ambulatory Visit: Payer: BC Managed Care – PPO

## 2023-02-17 ENCOUNTER — Other Ambulatory Visit: Payer: Self-pay | Admitting: Urology

## 2023-02-21 DIAGNOSIS — N200 Calculus of kidney: Secondary | ICD-10-CM | POA: Insufficient documentation

## 2023-02-21 NOTE — Progress Notes (Unsigned)
Name: Rodney Meyers DOB: Jan 16, 1956 MRN: [id number]  History of Present Illness: Rodney Meyers is a 67 y.o. male who presents today for follow up visit at [facility] [place]. - GU History: 1. Kidney stones.  Recent history:  - 613/2024: Urine culture positive for 10k E. Coli and 9k Enterococcus faecalis.  At last visit with Rodney Meyers on 02/01/2023: - Per note: "He was seen in the urgent care 6/13 with right flank pain and was started on tramadol and flomax. CT from 2021 shows bilateral renal calculi. KUB from today does not show a definitive calculus. No flank pain currently." - The plan was CT stone study and continue flomax 0.4mg  daily.  Since last visit: 02/01/2023: CT stone showed: 1. 6 mm calculus in the bladder which could be at the left UVJ but is not causing obstruction.*** 2. Small lower pole left renal calculus.  Today: He reports concern for possible UTI. He {Actions; denies-reports:120008} increased urinary urgency, frequency, dysuria, gross hematuria, straining to void, or sensations of incomplete emptying.  He {Actions; denies-reports:120008} acute flank pain / abdominal pain. He {Actions; denies-reports:120008} fevers.  He {Actions; denies-reports:120008} nausea/ vomiting.   Fall Screening: Do you usually have a device to assist in your mobility? {yes/no:20286}  ***cane / ***walker / ***wheelchair  Medications: Current Outpatient Medications  Medication Sig Dispense Refill   albuterol (VENTOLIN HFA) 108 (90 Base) MCG/ACT inhaler Inhale 2 puffs into the lungs every 4 (four) hours as needed for wheezing or shortness of breath. 18 g 2   ipratropium (ATROVENT) 0.03 % nasal spray Place 2 sprays into both nostrils 2 (two) times daily. 30 mL 0   metFORMIN (GLUCOPHAGE) 500 MG tablet Take 1 tablet (500 mg total) by mouth 2 (two) times daily with a meal. 180 tablet 3   nitrofurantoin, macrocrystal-monohydrate, (MACROBID) 100 MG capsule Take 1 capsule (100  mg total) by mouth 2 (two) times daily. 10 capsule 0   ondansetron (ZOFRAN-ODT) 4 MG disintegrating tablet Take 1 tablet (4 mg total) by mouth every 8 (eight) hours as needed. 20 tablet 0   pantoprazole (PROTONIX) 40 MG tablet Take 1 tablet (40 mg total) by mouth daily. 90 tablet 3   polyethylene glycol powder (MIRALAX) 17 GM/SCOOP powder Take 1 scoop (17 Gm) by mouth daily for constipation.  May mix with water, juice or milk. 255 g 3   tamsulosin (FLOMAX) 0.4 MG CAPS capsule Take 1 capsule (0.4 mg total) by mouth daily. 30 capsule 0   tamsulosin (FLOMAX) 0.4 MG CAPS capsule TAKE 1 CAPSULE BY MOUTH EVERY DAY AFTER SUPPER 90 capsule 1   traMADol (ULTRAM) 50 MG tablet Take 1 tablet (50 mg total) by mouth every 12 (twelve) hours as needed. 10 tablet 0   No current facility-administered medications for this visit.    Allergies: No Known Allergies  Past Medical History:  Diagnosis Date   Asthma    Diabetes mellitus without complication (HCC)    Kidney stone    Past Surgical History:  Procedure Laterality Date   L. arm surgery     Family History  Problem Relation Age of Onset   Heart attack Father    Social History   Socioeconomic History   Marital status: Married    Spouse name: Not on file   Number of children: Not on file   Years of education: Not on file   Highest education level: Not on file  Occupational History   Not on file  Tobacco Use  Smoking status: Never   Smokeless tobacco: Never  Vaping Use   Vaping Use: Never used  Substance and Sexual Activity   Alcohol use: No   Drug use: No   Sexual activity: Not on file  Other Topics Concern   Not on file  Social History Narrative   Not on file   Social Determinants of Health   Financial Resource Strain: Not on file  Food Insecurity: Not on file  Transportation Needs: Not on file  Physical Activity: Not on file  Stress: Not on file  Social Connections: Not on file  Intimate Partner Violence: Not on file     SUBJECTIVE  Review of Systems Constitutional: Patient ***denies any unintentional weight loss or change in strength lntegumentary: Patient ***denies any rashes or pruritus Eyes: Patient denies ***dry eyes ENT: Patient ***denies dry mouth Cardiovascular: Patient ***denies chest pain or syncope Respiratory: Patient ***denies shortness of breath Gastrointestinal: Patient ***denies nausea, vomiting, constipation, or diarrhea Musculoskeletal: Patient ***denies muscle cramps or weakness Neurologic: Patient ***denies convulsions or seizures Psychiatric: Patient ***denies memory problems Allergic/Immunologic: Patient ***denies recent allergic reaction(s) Hematologic/Lymphatic: Patient denies bleeding tendencies Endocrine: Patient ***denies heat/cold intolerance  GU: As per HPI.  OBJECTIVE There were no vitals filed for this visit. There is no height or weight on file to calculate BMI.  Physical Examination  Constitutional: ***No obvious distress; patient is ***non-toxic appearing  Cardiovascular: ***No visible lower extremity edema.  Respiratory: The patient does ***not have audible wheezing/stridor; respirations do ***not appear labored  Gastrointestinal: Abdomen ***non-distended Musculoskeletal: ***Normal ROM of UEs  Skin: ***No obvious rashes/open sores  Neurologic: CN 2-12 grossly ***intact Psychiatric: Answered questions ***appropriately with ***normal affect  Hematologic/Lymphatic/Immunologic: ***No obvious bruises or sites of spontaneous bleeding  UA: {Desc; negative/positive:13464} for *** WBC/hpf, *** RBC/hpf, bacteria (***) *** nitrites, *** leukocytes, *** blood PVR: *** ml  ASSESSMENT No diagnosis found.  ***We reviewed recent imaging results; ***awaiting radiology results, appears to have ***no acute findings.  ***For stone prevention: Advised adequate hydration and we discussed option to consider low oxalate diet given that calcium oxalate is the most common  type of stone. Handout provided about stone prevention diet.  ***For recurrent stone formers: We discussed option to proceed with 24 hour urinalysis ([facility]) for metabolic evaluation, which may help with targeted recommendations for dietary I medication therapies for stone prevention. Patient elected to ***proceed/ ***hold off.  Will plan to follow up in ***6 months / ***1 year with ***KUB ***RUS for stone surveillance or sooner if needed.  Pt verbalized understanding and agreement. All questions were answered.  PLAN Advised the following: ***Maintain adequate fluid intake. ***Low oxalate diet. No follow-ups on file.  No orders of the defined types were placed in this encounter.   It has been explained that the patient is to follow regularly with their PCP in addition to all other providers involved in their care and to follow instructions provided by these respective offices. Patient advised to contact urology clinic if any urologic-pertaining questions, concerns, new symptoms or problems arise in the interim period.  There are no Patient Instructions on file for this visit.  Electronically signed by:  Donnita Falls, MSN, FNP-C, CUNP 02/21/2023 2:44 PM

## 2023-02-22 ENCOUNTER — Encounter: Payer: Self-pay | Admitting: Urology

## 2023-02-22 ENCOUNTER — Ambulatory Visit (INDEPENDENT_AMBULATORY_CARE_PROVIDER_SITE_OTHER): Payer: BC Managed Care – PPO | Admitting: Urology

## 2023-02-22 VITALS — BP 151/77 | HR 73 | Temp 97.8°F

## 2023-02-22 DIAGNOSIS — N200 Calculus of kidney: Secondary | ICD-10-CM | POA: Diagnosis not present

## 2023-02-22 DIAGNOSIS — R6883 Chills (without fever): Secondary | ICD-10-CM

## 2023-02-22 LAB — URINALYSIS, ROUTINE W REFLEX MICROSCOPIC
Bilirubin, UA: NEGATIVE
Glucose, UA: NEGATIVE
Ketones, UA: NEGATIVE
Leukocytes,UA: NEGATIVE
Nitrite, UA: NEGATIVE
Specific Gravity, UA: 1.03 (ref 1.005–1.030)
Urobilinogen, Ur: 1 mg/dL (ref 0.2–1.0)
pH, UA: 5.5 (ref 5.0–7.5)

## 2023-02-22 LAB — MICROSCOPIC EXAMINATION

## 2023-02-22 NOTE — Patient Instructions (Signed)

## 2023-02-24 ENCOUNTER — Telehealth: Payer: Self-pay

## 2023-02-24 NOTE — Telephone Encounter (Signed)
-----   Message from Wilkie Aye sent at 02/13/2023  7:50 AM EDT ----- CT shows a bladder calculus ----- Message ----- From: Grier Rocher, CMA Sent: 02/02/2023  10:16 AM EDT To: Malen Gauze, MD  OV on 06/19

## 2023-02-24 NOTE — Telephone Encounter (Signed)
Patient informed at follow up visit with [name]

## 2023-04-03 DIAGNOSIS — R030 Elevated blood-pressure reading, without diagnosis of hypertension: Secondary | ICD-10-CM | POA: Diagnosis not present

## 2023-04-03 DIAGNOSIS — Z6825 Body mass index (BMI) 25.0-25.9, adult: Secondary | ICD-10-CM | POA: Diagnosis not present

## 2023-04-03 DIAGNOSIS — E663 Overweight: Secondary | ICD-10-CM | POA: Diagnosis not present

## 2023-04-03 DIAGNOSIS — J069 Acute upper respiratory infection, unspecified: Secondary | ICD-10-CM | POA: Diagnosis not present

## 2023-06-05 ENCOUNTER — Emergency Department (HOSPITAL_COMMUNITY)
Admission: EM | Admit: 2023-06-05 | Discharge: 2023-06-06 | Disposition: A | Discharge status: Home / Self Care | Payer: BC Managed Care – PPO | Attending: Emergency Medicine | Admitting: Emergency Medicine

## 2023-06-05 ENCOUNTER — Emergency Department (HOSPITAL_COMMUNITY): Payer: BC Managed Care – PPO

## 2023-06-05 ENCOUNTER — Encounter (HOSPITAL_COMMUNITY): Payer: Self-pay

## 2023-06-05 ENCOUNTER — Other Ambulatory Visit: Payer: Self-pay

## 2023-06-05 DIAGNOSIS — E119 Type 2 diabetes mellitus without complications: Secondary | ICD-10-CM | POA: Insufficient documentation

## 2023-06-05 DIAGNOSIS — Z7984 Long term (current) use of oral hypoglycemic drugs: Secondary | ICD-10-CM | POA: Insufficient documentation

## 2023-06-05 DIAGNOSIS — J45909 Unspecified asthma, uncomplicated: Secondary | ICD-10-CM | POA: Insufficient documentation

## 2023-06-05 DIAGNOSIS — R200 Anesthesia of skin: Secondary | ICD-10-CM | POA: Diagnosis not present

## 2023-06-05 DIAGNOSIS — I700 Atherosclerosis of aorta: Secondary | ICD-10-CM | POA: Diagnosis not present

## 2023-06-05 DIAGNOSIS — G510 Bell's palsy: Secondary | ICD-10-CM | POA: Diagnosis not present

## 2023-06-05 DIAGNOSIS — R201 Hypoesthesia of skin: Secondary | ICD-10-CM | POA: Diagnosis not present

## 2023-06-05 LAB — CBC
HCT: 46.9 % (ref 39.0–52.0)
Hemoglobin: 14.7 g/dL (ref 13.0–17.0)
MCH: 25.6 pg — ABNORMAL LOW (ref 26.0–34.0)
MCHC: 31.3 g/dL (ref 30.0–36.0)
MCV: 81.7 fL (ref 80.0–100.0)
Platelets: 230 10*3/uL (ref 150–400)
RBC: 5.74 MIL/uL (ref 4.22–5.81)
RDW: 13.7 % (ref 11.5–15.5)
WBC: 7.9 10*3/uL (ref 4.0–10.5)
nRBC: 0 % (ref 0.0–0.2)

## 2023-06-05 LAB — BASIC METABOLIC PANEL
Anion gap: 7 (ref 5–15)
BUN: 25 mg/dL — ABNORMAL HIGH (ref 8–23)
CO2: 26 mmol/L (ref 22–32)
Calcium: 9.2 mg/dL (ref 8.9–10.3)
Chloride: 103 mmol/L (ref 98–111)
Creatinine, Ser: 1.43 mg/dL — ABNORMAL HIGH (ref 0.61–1.24)
GFR, Estimated: 54 mL/min — ABNORMAL LOW (ref >60)
Glucose, Bld: 130 mg/dL — ABNORMAL HIGH (ref 70–99)
Potassium: 3.9 mmol/L (ref 3.5–5.1)
Sodium: 136 mmol/L (ref 135–145)

## 2023-06-05 LAB — CBG MONITORING, ED: Glucose-Capillary: 114 mg/dL — ABNORMAL HIGH (ref 70–99)

## 2023-06-05 MED ORDER — IOHEXOL 350 MG/ML SOLN
75.0000 mL | Freq: Once | INTRAVENOUS | Status: AC | PRN
  Administered 2023-06-05: 75 mL via INTRAVENOUS

## 2023-06-05 NOTE — ED Triage Notes (Addendum)
Pt reports waking up at 0500 and right sided face numbness. Tongue has been numb x2 days.  Pt reports needing root canal on left side.  A&O x4. Ambulatory to triage.  Denies weakness, denies headache.  No neuro deficits noted.

## 2023-06-05 NOTE — ED Notes (Signed)
PA with patient at this time performing assessment

## 2023-06-05 NOTE — ED Notes (Signed)
In waiting room rounding on patients and noted patient to have a facial droop. PA aware.

## 2023-06-05 NOTE — ED Notes (Signed)
Patient transported to CT 

## 2023-06-06 ENCOUNTER — Ambulatory Visit: Payer: BC Managed Care – PPO | Admitting: Family Medicine

## 2023-06-06 VITALS — BP 141/74 | HR 83 | Temp 97.3°F | Ht 67.0 in | Wt 165.8 lb

## 2023-06-06 DIAGNOSIS — R2232 Localized swelling, mass and lump, left upper limb: Secondary | ICD-10-CM

## 2023-06-06 DIAGNOSIS — G510 Bell's palsy: Secondary | ICD-10-CM

## 2023-06-06 MED ORDER — PREDNISONE 50 MG PO TABS
60.0000 mg | ORAL_TABLET | Freq: Once | ORAL | Status: AC
  Administered 2023-06-06: 60 mg via ORAL
  Filled 2023-06-06: qty 1

## 2023-06-06 MED ORDER — VALACYCLOVIR HCL 1 G PO TABS: 1000.0000 mg | ORAL_TABLET | Freq: Three times a day (TID) | ORAL | 0 refills | Status: AC

## 2023-06-06 MED ORDER — PREDNISONE 20 MG PO TABS: 60.0000 mg | ORAL_TABLET | Freq: Every day | ORAL | 0 refills | Status: AC

## 2023-06-06 NOTE — Discharge Instructions (Signed)
You were evaluated in the Emergency Department and after careful evaluation, we did not find any emergent condition requiring admission or further testing in the hospital.  Your exam/testing today is overall reassuring.  Symptoms likely due to Bell's palsy.  Take the prednisone and valacyclovir medications as prescribed, follow-up with your primary care doctor.  Please return to the Emergency Department if you experience any worsening of your condition.   Thank you for allowing [place] to be a part of your care.

## 2023-06-06 NOTE — ED Provider Notes (Signed)
  Provider Note MRN:  [id number]  Arrival date & time: 06/06/23    ED Course and Medical Decision Making  Assumed care from PA Tammy Triplett at shift change.  Right-sided facial weakness, abnormal sensation/lack of taste to the right side of the tongue.  Definitely involves the right periorbital and eyebrow regions on my exam.  CTA was obtained which is normal.  No arm or leg symptoms, nothing to suggest stroke or central cause.  All seems consistent with Bell's palsy.  Appropriate for discharge.  Procedures  Final Clinical Impressions(s) / ED Diagnoses     ICD-10-CM   1. Bell's palsy  G51.0       ED Discharge Orders          Ordered    predniSONE (DELTASONE) 20 MG tablet  Daily        06/06/23 0147    valACYclovir (VALTREX) 1000 MG tablet  3 times daily        06/06/23 0147              Discharge Instructions      You were evaluated in the Emergency Department and after careful evaluation, we did not find any emergent condition requiring admission or further testing in the hospital.  Your exam/testing today is overall reassuring.  Symptoms likely due to Bell's palsy.  Take the prednisone and valacyclovir medications as prescribed, follow-up with your primary care doctor.  Please return to the Emergency Department if you experience any worsening of your condition.   Thank you for allowing [place] to be a part of your care.    Elmer Sow. Pilar Plate, MD [facility] [email or web address]    Sabas Sous, MD 06/06/23 [phone]

## 2023-06-06 NOTE — Patient Instructions (Signed)
Finish the medications.  I am reaching out to a colleague of mine regarding the arm. We will be in touch.  Take care  Dr. Adriana Simas

## 2023-06-06 NOTE — ED Provider Notes (Signed)
[place] EMERGENCY DEPARTMENT AT [facility] Provider Note   CSN: [id number] Arrival date & time: 06/05/23  1655     History  Chief Complaint  Patient presents with   Numbness    Rodney Meyers is a 67 y.o. male.  HPI      Rodney Meyers is a 67 y.o. male with past medical history of chronic asthma, type 2 diabetes, GERD who presents to the Emergency Department complaining of numbness of the right side of his tongue x 2 days.  He woke this morning with numbness of his right face with difficulty raising his eyebrow and closing his eye.  He denies any headache or dizziness.  No visual changes.  No changes in speech, or symptoms of his extremities.  Denies any new medications or recent illness   Home Medications Prior to Admission medications   Medication Sig Start Date End Date Taking? Authorizing Provider  albuterol (VENTOLIN HFA) 108 (90 Base) MCG/ACT inhaler Inhale 2 puffs into the lungs every 4 (four) hours as needed for wheezing or shortness of breath. 09/20/22   Everlene Other G, DO  ipratropium (ATROVENT) 0.03 % nasal spray Place 2 sprays into both nostrils 2 (two) times daily. 09/09/21   Wallis Bamberg, PA-C  metFORMIN (GLUCOPHAGE) 500 MG tablet Take 1 tablet (500 mg total) by mouth 2 (two) times daily with a meal. 05/31/22   Cook, Verdis Frederickson, DO  nitrofurantoin, macrocrystal-monohydrate, (MACROBID) 100 MG capsule Take 1 capsule (100 mg total) by mouth 2 (two) times daily. 01/30/23   Lamptey, Britta Mccreedy, MD  ondansetron (ZOFRAN-ODT) 4 MG disintegrating tablet Take 1 tablet (4 mg total) by mouth every 8 (eight) hours as needed. 01/26/23   Leath-Warren, Sadie Haber, NP  pantoprazole (PROTONIX) 40 MG tablet Take 1 tablet (40 mg total) by mouth daily. 09/20/22   Tommie Sams, DO  polyethylene glycol powder (MIRALAX) 17 GM/SCOOP powder Take 1 scoop (17 Gm) by mouth daily for constipation.  May mix with water, juice or milk. 03/23/20   Avegno, Zachery Dakins, FNP  tamsulosin (FLOMAX) 0.4  MG CAPS capsule Take 1 capsule (0.4 mg total) by mouth daily. 02/01/23   McKenzie, Mardene Celeste, MD  tamsulosin (FLOMAX) 0.4 MG CAPS capsule TAKE 1 CAPSULE BY MOUTH EVERY DAY AFTER SUPPER 02/21/23   McKenzie, Mardene Celeste, MD  traMADol (ULTRAM) 50 MG tablet Take 1 tablet (50 mg total) by mouth every 12 (twelve) hours as needed. 01/26/23   Leath-Warren, Sadie Haber, NP      Allergies    Patient has no known allergies.    Review of Systems   Review of Systems  Constitutional:  Negative for appetite change, chills and fever.  HENT:  Negative for congestion, facial swelling and trouble swallowing.   Eyes:  Negative for pain and visual disturbance.  Respiratory:  Negative for shortness of breath.   Cardiovascular:  Negative for chest pain.  Gastrointestinal:  Negative for abdominal pain, diarrhea, nausea and vomiting.  Musculoskeletal:  Negative for arthralgias and neck pain.  Neurological:  Positive for facial asymmetry. Negative for dizziness, syncope, speech difficulty, weakness and headaches.    Physical Exam Updated Vital Signs BP (!) 150/77   Pulse (!) 52   Temp 98.2 F (36.8 C) (Oral)   Resp 15   Wt 77 kg   SpO2 98%   BMI 26.59 kg/m  Physical Exam Vitals and nursing note reviewed.  Constitutional:      General: He is not in acute distress.  Appearance: Normal appearance. He is not ill-appearing or toxic-appearing.  HENT:     Mouth/Throat:     Mouth: Mucous membranes are moist.  Eyes:     Extraocular Movements: Extraocular movements intact.     Conjunctiva/sclera: Conjunctivae normal.     Pupils: Pupils are equal, round, and reactive to light.  Cardiovascular:     Rate and Rhythm: Normal rate and regular rhythm.     Pulses: Normal pulses.  Pulmonary:     Effort: Pulmonary effort is normal.  Abdominal:     Palpations: Abdomen is soft.     Tenderness: There is no abdominal tenderness.  Musculoskeletal:        General: Normal range of motion.     Cervical back: Normal range  of motion. No tenderness.  Skin:    General: Skin is warm.     Capillary Refill: Capillary refill takes less than 2 seconds.  Neurological:     Mental Status: He is alert and oriented to person, place, and time.     Cranial Nerves: Facial asymmetry present.     Motor: Motor function is intact.     Coordination: Coordination is intact.     Comments: Patient has difficulty raising right eyebrow, and closing right eyelid.  Right side of mouth is drooped, speech clear.  No unilateral extremity weakness, normal finger-nose testing     ED Results / Procedures / Treatments   Labs (all labs ordered are listed, but only abnormal results are displayed) Labs Reviewed  BASIC METABOLIC PANEL - Abnormal; Notable for the following components:      Result Value   Glucose, Bld 130 (*)    BUN 25 (*)    Creatinine, Ser 1.43 (*)    GFR, Estimated 54 (*)    All other components within normal limits  CBC - Abnormal; Notable for the following components:   MCH 25.6 (*)    All other components within normal limits  CBG MONITORING, ED - Abnormal; Notable for the following components:   Glucose-Capillary 114 (*)    All other components within normal limits    EKG None  Radiology No results found.  Procedures Procedures    Medications Ordered in ED Medications  iohexol (OMNIPAQUE) 350 MG/ML injection 75 mL (75 mLs Intravenous Contrast Given 06/05/23 2242)    ED Course/ Medical Decision Making/ A&P                                 Medical Decision Making Patient here for evaluation of 2-day history of numbness of the right tongue.  He woke this morning with numbness of his right face difficulty closing his right eye and drooping of the right side of his mouth.  No headache or visual changes.  No extremity weakness or numbness.  Would include but not limited to Bell's palsy, TIA, CVA, tumor.  I suspect Bell's palsy, patient will need labs with CT head and CTA head and C-spine  Amount and/or  Complexity of Data Reviewed Labs: ordered.    Details: Labs interpreted by me, no evidence of leukocytosis, chemistries show some kidney dysfunction but near patient's baseline Radiology: ordered.    Details: CT head and CTA head and neck results are pending Discussion of management or test interpretation with external provider(s): Discussed findings with overnight provider, Dr. Pilar Plate who assumes care and will review imaging.  If Bell's palsy patient may benefit from course of steroids,  but will need close monitoring of his blood sugar  Risk Prescription drug management.           Final Clinical Impression(s) / ED Diagnoses Final diagnoses:  None    Rx / DC Orders ED Discharge Orders     None         Pauline Aus, PA-C 06/06/23 0101    Bethann Berkshire, MD 06/07/23 1252

## 2023-06-07 DIAGNOSIS — R2232 Localized swelling, mass and lump, left upper limb: Secondary | ICD-10-CM | POA: Insufficient documentation

## 2023-06-07 DIAGNOSIS — G510 Bell's palsy: Secondary | ICD-10-CM | POA: Insufficient documentation

## 2023-06-07 NOTE — Assessment & Plan Note (Signed)
Hopeful that patient will make a full recovery given fairly quick diagnosis and aggressive treatment.  Advised compliance with prednisone and valacyclovir.
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
## 2023-06-07 NOTE — Assessment & Plan Note (Signed)
Etiology unclear.  Arranging ultrasound.

## 2023-06-08 ENCOUNTER — Telehealth: Payer: Self-pay

## 2023-06-08 NOTE — Telephone Encounter (Signed)
Called and left message for patient to call office (Nurse Note* Please inform patient that Dr Adriana Simas has ordered an ultrasound for evaluation of his enlarged left bicep.)

## 2023-06-14 ENCOUNTER — Ambulatory Visit (HOSPITAL_COMMUNITY): Admission: RE | Admit: 2023-06-14 | Payer: BC Managed Care – PPO | Source: Ambulatory Visit

## 2023-06-16 ENCOUNTER — Telehealth: Payer: Self-pay | Admitting: *Deleted

## 2023-06-16 NOTE — Telephone Encounter (Signed)
Everlene Other G, DO  Please reach out to patient. He did not get the [place]. Needs to get this.

## 2023-06-16 NOTE — Telephone Encounter (Signed)
Did reach out to the patient clarified ultrasound orders and scheduling need.

## 2023-07-30 ENCOUNTER — Other Ambulatory Visit: Payer: Self-pay | Admitting: Family Medicine

## 2023-08-14 NOTE — Progress Notes (Signed)
 Name: Rodney Meyers DOB: [birth year] MRN: [id number]  History of Present Illness: Mr. Maselli is a 67 y.o. male who presents today for follow up visit at [facility]. - GU History: 1. Kidney stones.  CT stone on 02/01/2023: 1. 6 mm calculus in the bladder which could be at the left UVJ but is not causing obstruction. 2. Small lower pole left renal calculus.  At last visit on 02/22/2023: - Reports that he passed the left ureteral stone last night (he states he saw it).  - No acute findings.  - Advised follow up in 6 months with KUB for stone surveillance.   Today: No KUB performed prior to today's visit.  He denies recent stone passage. He denies flank pain or abdominal pain. He denies increased urinary urgency, frequency, dysuria, gross hematuria, hesitancy, straining to void, or sensations of incomplete emptying. Reports nocturia x1-2 which he states is not significantly bothersome.   Fall Screening: Do you usually have a device to assist in your mobility? No   Medications: Current Outpatient Medications  Medication Sig Dispense Refill   albuterol  (VENTOLIN  HFA) 108 (90 Base) MCG/ACT inhaler Inhale 2 puffs into the lungs every 4 (four) hours as needed for wheezing or shortness of breath. 18 g 2   ipratropium (ATROVENT ) 0.03 % nasal spray Place 2 sprays into both nostrils 2 (two) times daily. 30 mL 0   metFORMIN  (GLUCOPHAGE ) 500 MG tablet TAKE 1 TABLET BY MOUTH 2 TIMES DAILY WITH A MEAL. 180 tablet 3   nitrofurantoin , macrocrystal-monohydrate, (MACROBID ) 100 MG capsule Take 1 capsule (100 mg total) by mouth 2 (two) times daily. 10 capsule 0   ondansetron  (ZOFRAN -ODT) 4 MG disintegrating tablet Take 1 tablet (4 mg total) by mouth every 8 (eight) hours as needed. 20 tablet 0   pantoprazole  (PROTONIX ) 40 MG tablet Take 1 tablet (40 mg total) by mouth daily. 90 tablet 3   polyethylene glycol powder (MIRALAX ) 17 GM/SCOOP powder Take 1 scoop (17 Gm) by mouth  daily for constipation.  May mix with water, juice or milk. 255 g 3   tamsulosin  (FLOMAX ) 0.4 MG CAPS capsule Take 1 capsule (0.4 mg total) by mouth daily. 30 capsule 0   tamsulosin  (FLOMAX ) 0.4 MG CAPS capsule TAKE 1 CAPSULE BY MOUTH EVERY DAY AFTER SUPPER 90 capsule 1   traMADol  (ULTRAM ) 50 MG tablet Take 1 tablet (50 mg total) by mouth every 12 (twelve) hours as needed. 10 tablet 0   No current facility-administered medications for this visit.    Allergies: No Known Allergies  Past Medical History:  Diagnosis Date   Asthma    Diabetes mellitus without complication (HCC)    Kidney stone    Past Surgical History:  Procedure Laterality Date   L. arm surgery     Family History  Problem Relation Age of Onset   Heart attack Father    Social History   Socioeconomic History   Marital status: Married    Spouse name: Not on file   Number of children: Not on file   Years of education: Not on file   Highest education level: Not on file  Occupational History   Not on file  Tobacco Use   Smoking status: Never   Smokeless tobacco: Never  Vaping Use   Vaping status: Never Used  Substance and Sexual Activity   Alcohol use: No   Drug use: No   Sexual activity: Not on file  Other Topics Concern   Not on  file  Social History Narrative   Not on file   Social Drivers of Health   Financial Resource Strain: Not on file  Food Insecurity: Not on file  Transportation Needs: Not on file  Physical Activity: Not on file  Stress: Not on file  Social Connections: Not on file  Intimate Partner Violence: Not on file    SUBJECTIVE  Review of Systems Constitutional: Patient denies any unintentional weight loss or change in strength lntegumentary: Patient denies any rashes or pruritus Cardiovascular: Patient denies chest pain or syncope Respiratory: Patient denies shortness of breath Gastrointestinal: Patient denies nausea, vomiting, constipation, or diarrhea Musculoskeletal:  Patient denies muscle cramps or weakness Neurologic: Patient denies convulsions or seizures Allergic/Immunologic: Patient denies recent allergic reaction(s) Hematologic/Lymphatic: Patient denies bleeding tendencies Endocrine: Patient denies heat/cold intolerance  GU: As per HPI.  OBJECTIVE Vitals:   08/24/23 1522  BP: (!) 157/72  Pulse: 67  Temp: 98.1 F (36.7 C)   There is no height or weight on file to calculate BMI.  Physical Examination Constitutional: No obvious distress; patient is non-toxic appearing  Cardiovascular: No visible lower extremity edema.  Respiratory: The patient does not have audible wheezing/stridor; respirations do not appear labored  Gastrointestinal: Abdomen non-distended Musculoskeletal: Normal ROM of UEs  Skin: No obvious rashes/open sores  Neurologic: CN 2-12 grossly intact Psychiatric: Answered questions appropriately with normal affect  Hematologic/Lymphatic/Immunologic: No obvious bruises or sites of spontaneous bleeding  UA: no leukocytes, blood, nitrites  ASSESSMENT Kidney stones - Plan: Urinalysis, Routine w reflex microscopic, DG Abd 1 View  No acute findings today. Advised KUB for stone surveillance within 1-2 weeks. If no acute findings, can follow up in 1 year. Pt verbalized understanding and agreement. All questions were answered.  PLAN Advised the following: KUB today. Return in about 1 year (around 08/23/2024) for KUB, UA, PVR, & f/u with Lauraine Oz NP.  Orders Placed This Encounter  Procedures   DG Abd 1 View    Standing Status:   Future    Expected Date:   08/23/2024    Expiration Date:   08/23/2024    Reason for Exam (SYMPTOM  OR DIAGNOSIS REQUIRED):   kidney stone    Preferred imaging location?:   [facility]   Urinalysis, Routine w reflex microscopic    It has been explained that the patient is to follow regularly with their PCP in addition to all other providers involved in their care and to follow instructions  provided by these respective offices. Patient advised to contact urology clinic if any urologic-pertaining questions, concerns, new symptoms or problems arise in the interim period.  There are no Patient Instructions on file for this visit.  Electronically signed by:  Lauraine KYM Oz, MSN, FNP-C, CUNP 08/24/2023 3:37 PM

## 2023-08-24 ENCOUNTER — Ambulatory Visit (INDEPENDENT_AMBULATORY_CARE_PROVIDER_SITE_OTHER): Payer: BC Managed Care – PPO | Admitting: Urology

## 2023-08-24 VITALS — BP 157/72 | HR 67 | Temp 98.1°F

## 2023-08-24 DIAGNOSIS — Z09 Encounter for follow-up examination after completed treatment for conditions other than malignant neoplasm: Secondary | ICD-10-CM | POA: Diagnosis not present

## 2023-08-24 DIAGNOSIS — Z87442 Personal history of urinary calculi: Secondary | ICD-10-CM

## 2023-08-24 DIAGNOSIS — N200 Calculus of kidney: Secondary | ICD-10-CM

## 2023-08-25 LAB — URINALYSIS, ROUTINE W REFLEX MICROSCOPIC
Bilirubin, UA: NEGATIVE
Leukocytes,UA: NEGATIVE
Nitrite, UA: NEGATIVE
Protein,UA: NEGATIVE
RBC, UA: NEGATIVE
Specific Gravity, UA: 1.03 (ref 1.005–1.030)
Urobilinogen, Ur: 0.2 mg/dL (ref 0.2–1.0)
pH, UA: 6 (ref 5.0–7.5)

## 2023-09-03 ENCOUNTER — Encounter (HOSPITAL_COMMUNITY): Payer: Self-pay | Admitting: *Deleted

## 2023-09-03 ENCOUNTER — Other Ambulatory Visit: Payer: Self-pay

## 2023-09-03 ENCOUNTER — Emergency Department (HOSPITAL_COMMUNITY)
Admission: EM | Admit: 2023-09-03 | Discharge: 2023-09-03 | Disposition: A | Discharge status: Home / Self Care | Payer: BC Managed Care – PPO | Attending: Emergency Medicine | Admitting: Emergency Medicine

## 2023-09-03 ENCOUNTER — Emergency Department (HOSPITAL_COMMUNITY): Payer: BC Managed Care – PPO

## 2023-09-03 DIAGNOSIS — M62830 Muscle spasm of back: Secondary | ICD-10-CM | POA: Diagnosis not present

## 2023-09-03 DIAGNOSIS — M546 Pain in thoracic spine: Secondary | ICD-10-CM | POA: Diagnosis not present

## 2023-09-03 DIAGNOSIS — Z7984 Long term (current) use of oral hypoglycemic drugs: Secondary | ICD-10-CM | POA: Insufficient documentation

## 2023-09-03 DIAGNOSIS — J45909 Unspecified asthma, uncomplicated: Secondary | ICD-10-CM | POA: Diagnosis not present

## 2023-09-03 DIAGNOSIS — E860 Dehydration: Secondary | ICD-10-CM | POA: Diagnosis not present

## 2023-09-03 DIAGNOSIS — E119 Type 2 diabetes mellitus without complications: Secondary | ICD-10-CM | POA: Insufficient documentation

## 2023-09-03 DIAGNOSIS — M19012 Primary osteoarthritis, left shoulder: Secondary | ICD-10-CM | POA: Diagnosis not present

## 2023-09-03 DIAGNOSIS — M25512 Pain in left shoulder: Secondary | ICD-10-CM | POA: Diagnosis not present

## 2023-09-03 DIAGNOSIS — R9431 Abnormal electrocardiogram [ECG] [EKG]: Secondary | ICD-10-CM | POA: Diagnosis not present

## 2023-09-03 LAB — CBC WITH DIFFERENTIAL/PLATELET
Abs Immature Granulocytes: 0.04 10*3/uL (ref 0.00–0.07)
Basophils Absolute: 0.1 10*3/uL (ref 0.0–0.1)
Basophils Relative: 1 %
Eosinophils Absolute: 0.3 10*3/uL (ref 0.0–0.5)
Eosinophils Relative: 5 %
HCT: 41.9 % (ref 39.0–52.0)
Hemoglobin: 13.4 g/dL (ref 13.0–17.0)
Immature Granulocytes: 1 %
Lymphocytes Relative: 27 %
Lymphs Abs: 1.8 10*3/uL (ref 0.7–4.0)
MCH: 26.2 pg (ref 26.0–34.0)
MCHC: 32 g/dL (ref 30.0–36.0)
MCV: 81.8 fL (ref 80.0–100.0)
Monocytes Absolute: 0.5 10*3/uL (ref 0.1–1.0)
Monocytes Relative: 8 %
Neutro Abs: 3.9 10*3/uL (ref 1.7–7.7)
Neutrophils Relative %: 58 %
Platelets: 210 10*3/uL (ref 150–400)
RBC: 5.12 MIL/uL (ref 4.22–5.81)
RDW: 13.2 % (ref 11.5–15.5)
WBC: 6.6 10*3/uL (ref 4.0–10.5)
nRBC: 0 % (ref 0.0–0.2)

## 2023-09-03 LAB — COMPREHENSIVE METABOLIC PANEL
ALT: 19 U/L (ref 0–44)
AST: 19 U/L (ref 15–41)
Albumin: 3.8 g/dL (ref 3.5–5.0)
Alkaline Phosphatase: 92 U/L (ref 38–126)
Anion gap: 7 (ref 5–15)
BUN: 20 mg/dL (ref 8–23)
CO2: 21 mmol/L — ABNORMAL LOW (ref 22–32)
Calcium: 8.5 mg/dL — ABNORMAL LOW (ref 8.9–10.3)
Chloride: 106 mmol/L (ref 98–111)
Creatinine, Ser: 1.12 mg/dL (ref 0.61–1.24)
GFR, Estimated: >60 mL/min (ref >60)
Glucose, Bld: 290 mg/dL — ABNORMAL HIGH (ref 70–99)
Potassium: 4 mmol/L (ref 3.5–5.1)
Sodium: 134 mmol/L — ABNORMAL LOW (ref 135–145)
Total Bilirubin: 0.4 mg/dL (ref 0.0–1.2)
Total Protein: 6.6 g/dL (ref 6.5–8.1)

## 2023-09-03 LAB — TROPONIN I (HIGH SENSITIVITY): Troponin I (High Sensitivity): 3 ng/L (ref <18)

## 2023-09-03 MED ORDER — CYCLOBENZAPRINE HCL 10 MG PO TABS
5.0000 mg | ORAL_TABLET | Freq: Once | ORAL | Status: AC
  Administered 2023-09-03: 5 mg via ORAL
  Filled 2023-09-03: qty 1

## 2023-09-03 MED ORDER — CYCLOBENZAPRINE HCL 10 MG PO TABS: 10.0000 mg | ORAL_TABLET | Freq: Two times a day (BID) | ORAL | 0 refills | Status: DC | PRN

## 2023-09-03 NOTE — Discharge Instructions (Signed)
You are seen in the emergency department today with concerns of shoulder pain.  Thankfully your labs and imaging were reassuring and there is no evidence of any heart irregularity ongoing at this time.  I believe that your symptoms are due to a spasm in the upper back.  I sent a prescription for Flexeril to your pharmacy.  Please take this as prescribed and follow-up with your primary care provider.  Return to the emergency department for any new or worsening symptoms.

## 2023-09-03 NOTE — ED Triage Notes (Signed)
Pt with left shoulder pain x 3 days, denies any injury. Pain worse with movement.

## 2023-09-03 NOTE — ED Provider Notes (Signed)
[place] EMERGENCY DEPARTMENT AT [facility] Provider Note   CSN: [id number] Arrival date & time: 09/03/23  1735     History Chief Complaint  Patient presents with   Shoulder Pain    Carolos U Hoiland is a 68 y.o. male.  Patient with significant history of asthma, diabetes, GERD here with concerns of shoulder pain.  States he been experiencing left-sided shoulder pain for about 3 days.  He feels that the pain is primarily towards his upper back without any specific radiation.  Denies any chest pain shortness of breath.  On blood thinners.  No prior history of any cardiac events.  No nausea, vomiting, or diaphoresis.   Shoulder Pain      Home Medications Prior to Admission medications   Medication Sig Start Date End Date Taking? Authorizing Provider  cyclobenzaprine (FLEXERIL) 10 MG tablet Take 1 tablet (10 mg total) by mouth 2 (two) times daily as needed for muscle spasms. 09/03/23  Yes Smitty Knudsen, PA-C  albuterol (VENTOLIN HFA) 108 (90 Base) MCG/ACT inhaler Inhale 2 puffs into the lungs every 4 (four) hours as needed for wheezing or shortness of breath. 09/20/22   Everlene Other G, DO  ipratropium (ATROVENT) 0.03 % nasal spray Place 2 sprays into both nostrils 2 (two) times daily. 09/09/21   Wallis Bamberg, PA-C  metFORMIN (GLUCOPHAGE) 500 MG tablet TAKE 1 TABLET BY MOUTH 2 TIMES DAILY WITH A MEAL. 07/31/23   Tommie Sams, DO  nitrofurantoin, macrocrystal-monohydrate, (MACROBID) 100 MG capsule Take 1 capsule (100 mg total) by mouth 2 (two) times daily. 01/30/23   Lamptey, Britta Mccreedy, MD  ondansetron (ZOFRAN-ODT) 4 MG disintegrating tablet Take 1 tablet (4 mg total) by mouth every 8 (eight) hours as needed. 01/26/23   Leath-Warren, Sadie Haber, NP  pantoprazole (PROTONIX) 40 MG tablet Take 1 tablet (40 mg total) by mouth daily. 09/20/22   Tommie Sams, DO  polyethylene glycol powder (MIRALAX) 17 GM/SCOOP powder Take 1 scoop (17 Gm) by mouth daily for constipation.  May mix with  water, juice or milk. 03/23/20   Avegno, Zachery Dakins, FNP  tamsulosin (FLOMAX) 0.4 MG CAPS capsule Take 1 capsule (0.4 mg total) by mouth daily. 02/01/23   McKenzie, Mardene Celeste, MD  tamsulosin (FLOMAX) 0.4 MG CAPS capsule TAKE 1 CAPSULE BY MOUTH EVERY DAY AFTER SUPPER 02/21/23   McKenzie, Mardene Celeste, MD  traMADol (ULTRAM) 50 MG tablet Take 1 tablet (50 mg total) by mouth every 12 (twelve) hours as needed. 01/26/23   Leath-Warren, Sadie Haber, NP      Allergies    Patient has no known allergies.    Review of Systems   Review of Systems  Musculoskeletal:        Shoulder pain  All other systems reviewed and are negative.   Physical Exam Updated Vital Signs BP (!) 152/70   Pulse 61   Temp 97.9 F (36.6 C) (Oral)   Resp (!) 22   Ht 5\' 7"  (1.702 m)   Wt 77.1 kg   SpO2 100%   BMI 26.63 kg/m  Physical Exam Vitals and nursing note reviewed.  Constitutional:      General: He is not in acute distress.    Appearance: He is well-developed.  HENT:     Head: Normocephalic and atraumatic.  Eyes:     Conjunctiva/sclera: Conjunctivae normal.  Cardiovascular:     Rate and Rhythm: Normal rate and regular rhythm.     Heart sounds: No murmur heard. Pulmonary:  Effort: Pulmonary effort is normal. No respiratory distress.     Breath sounds: Normal breath sounds.  Abdominal:     Palpations: Abdomen is soft.     Tenderness: There is no abdominal tenderness.  Musculoskeletal:        General: Tenderness present. No swelling, deformity or signs of injury. Normal range of motion.       Arms:     Cervical back: Neck supple.     Comments: Tenderness palpation overlying the left scapula towards the midline/paraspinal muscles.  No abnormal range of motion testing of the left shoulder to suggest rotator cuff injury.  Skin:    General: Skin is warm and dry.     Capillary Refill: Capillary refill takes less than 2 seconds.  Neurological:     Mental Status: He is alert.  Psychiatric:        Mood and  Affect: Mood normal.     ED Results / Procedures / Treatments   Labs (all labs ordered are listed, but only abnormal results are displayed) Labs Reviewed  COMPREHENSIVE METABOLIC PANEL - Abnormal; Notable for the following components:      Result Value   Sodium 134 (*)    CO2 21 (*)    Glucose, Bld 290 (*)    Calcium 8.5 (*)    All other components within normal limits  CBC WITH DIFFERENTIAL/PLATELET  TROPONIN I (HIGH SENSITIVITY)    EKG None  Radiology DG Shoulder Left Result Date: 09/03/2023 CLINICAL DATA:  Stabbing pain, worse with movement EXAM: LEFT SHOULDER - 2+ VIEW COMPARISON:  None Available. FINDINGS: Glenohumeral joint is normal. 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 No regional traumatic finding. Ribs appear normal. 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 Electronically Signed   By: Paulina Fusi M.D.   On: 09/03/2023 18:25    Procedures Procedures    Medications Ordered in ED Medications  cyclobenzaprine (FLEXERIL) tablet 5 mg (5 mg Oral Given 09/03/23 1827)    ED Course/ Medical Decision Making/ A&P                                 Medical Decision Making Amount and/or Complexity of Data Reviewed Labs: ordered. Radiology: ordered.  Risk Prescription drug management.   This patient presents to the ED for concern of shoulder pain.  Differential diagnosis includes shoulder dislocation, ACS, PE, muscle spasm   Lab Tests:  I Ordered, and personally interpreted labs.  The pertinent results include: CBC unremarkable, CMP with mild dehydration with sodium 134 but no evidence of AKI, troponin negative at 3   Imaging Studies ordered:  I ordered imaging studies including x-ray left shoulder I independently visualized and interpreted imaging which showed mild osteoarthritis at the [facility] joint. I agree with the radiologist interpretation   Medicines ordered and prescription drug management:  I ordered medication including Flexeril for  spasm Reevaluation of the patient after these medicines showed that the patient improved I have reviewed the patients home medicines and have made adjustments as needed   Problem List / ED Course:  Patient presents the emergency department concerns of left-sided shoulder pain.  States been ongoing for the last 3 days.  No recent injury, fall, or other mechanism of injury.  States that he has never had symptoms similar to this.  No chest pain or shortness of breath.  On exam, there appears to be some possible spasming at the paraspinal muscles/trapezius and  left shoulder.  No midline tenderness.  No abnormal lung sounds or heart murmurs present. Given exam findings we will proceed with x-ray imaging as well as EKG and some basic labs to rule out possible cardiac cause.  I doubt cardiac findings as patient is not acutely endorsing any chest pain or shortness of breath. Patient's workup is thankfully reassuring without any acute findings to suggest ACS.  X-ray does show some mild osteoarthritis of AC joint but this is not the area of tenderness that patient is currently describing.  After administration of the Flexeril, patient does report some improvement in his symptoms.  I do suspect that this is likely a spasming of the muscles in the paraspinal and trapezius region of the left shoulder.  Given some of this improvement, I will discharge patient home with a prescription for Flexeril for continued use at home.  Also encourage patient to continue using ibuprofen and naproxen as needed.  Discussed return precautions such as worsening symptoms or progression to chest pain shortness of breath.  Patient otherwise stable and discharged home.   Final Clinical Impression(s) / ED Diagnoses Final diagnoses:  Spasm of thoracic back muscle    Rx / DC Orders ED Discharge Orders          Ordered    cyclobenzaprine (FLEXERIL) 10 MG tablet  2 times daily PRN        09/03/23 2007              Salomon Mast 09/03/23 2008    Gloris Manchester, MD 09/04/23 [phone]

## 2023-09-07 ENCOUNTER — Ambulatory Visit: Payer: BC Managed Care – PPO | Admitting: Family Medicine

## 2023-09-07 ENCOUNTER — Encounter: Payer: Self-pay | Admitting: Family Medicine

## 2023-09-07 VITALS — BP 136/72 | HR 63 | Temp 97.6°F | Ht 67.0 in | Wt 176.0 lb

## 2023-09-07 DIAGNOSIS — M7918 Myalgia, other site: Secondary | ICD-10-CM | POA: Diagnosis not present

## 2023-09-07 MED ORDER — CYCLOBENZAPRINE HCL 10 MG PO TABS: 10.0000 mg | ORAL_TABLET | Freq: Two times a day (BID) | ORAL | 0 refills | Status: DC | PRN

## 2023-09-07 MED ORDER — MELOXICAM 15 MG PO TABS: 15.0000 mg | ORAL_TABLET | Freq: Every day | ORAL | 0 refills | Status: AC | PRN

## 2023-09-07 MED ORDER — BACLOFEN 10 MG PO TABS: 5.0000 mg | ORAL_TABLET | Freq: Three times a day (TID) | ORAL | 0 refills | Status: AC | PRN

## 2023-09-07 NOTE — Patient Instructions (Signed)
Heat!  Medications as directed.  Take care  Dr. Adriana Simas

## 2023-09-08 DIAGNOSIS — M7918 Myalgia, other site: Secondary | ICD-10-CM | POA: Insufficient documentation

## 2023-09-08 NOTE — Progress Notes (Signed)
Subjective:  Patient ID: Rodney Meyers, male    DOB: 01-Jan-1956  Age: 68 y.o. MRN: [id number]  CC:   Chief Complaint  Patient presents with   Follow-up    Left shoulder pain. 3-4 days. No recent injury.     HPI:  68 year old male presents for evaluation above.  Started on Friday.  He reports pain in the left upper back and left trapezius.  Makes movement of the shoulder difficult.  Certain movements make it worse.  No recent fall, trauma, injury.  Was treated in the ER on 1/19.  Has been using cyclobenzaprine without relief.  Patient Active Problem List   Diagnosis Date Noted   Musculoskeletal pain 09/08/2023   Arm mass, left 06/07/2023   Kidney stones 02/21/2023   GERD (gastroesophageal reflux disease) 05/23/2022   Arthralgia 05/23/2022   Mixed hyperlipidemia 07/24/2013   Diabetes (HCC) 07/13/2013   Asthma, chronic 12/16/2012    Social Hx   Social History   Socioeconomic History   Marital status: Married    Spouse name: Not on file   Number of children: Not on file   Years of education: Not on file   Highest education level: Not on file  Occupational History   Not on file  Tobacco Use   Smoking status: Never   Smokeless tobacco: Never  Vaping Use   Vaping status: Never Used  Substance and Sexual Activity   Alcohol use: No   Drug use: No   Sexual activity: Not on file  Other Topics Concern   Not on file  Social History Narrative   Not on file   Social Drivers of Health   Financial Resource Strain: Not on file  Food Insecurity: Not on file  Transportation Needs: Not on file  Physical Activity: Not on file  Stress: Not on file  Social Connections: Not on file    Review of Systems Per HPI  Objective:  BP 136/72   Pulse 63   Temp 97.6 F (36.4 C)   Ht 5\' 7"  (1.702 m)   Wt 176 lb (79.8 kg)   SpO2 97%   BMI 27.57 kg/m      09/07/2023    4:14 PM 09/03/2023    8:30 PM 09/03/2023    7:30 PM  BP/Weight  Systolic BP 136 149 149  Diastolic BP  72 72 91  Wt. (Lbs) 176    BMI 27.57 kg/m2      Physical Exam Vitals and nursing note reviewed.  Constitutional:      General: He is not in acute distress.    Appearance: Normal appearance.  Cardiovascular:     Rate and Rhythm: Normal rate and regular rhythm.  Pulmonary:     Effort: Pulmonary effort is normal.     Breath sounds: Normal breath sounds. No wheezing, rhonchi or rales.  Musculoskeletal:     Comments: Patient with decreased range of motion of the right shoulder due to pain.  Neurological:     Mental Status: He is alert.     Lab Results  Component Value Date   WBC 6.6 09/03/2023   HGB 13.4 09/03/2023   HCT 41.9 09/03/2023   PLT 210 09/03/2023   GLUCOSE 290 (H) 09/03/2023   CHOL 232 (H) 05/23/2022   TRIG 398 (H) 05/23/2022   HDL 34 (L) 05/23/2022   LDLCALC 127 (H) 05/23/2022   ALT 19 09/03/2023   AST 19 09/03/2023   NA 134 (L) 09/03/2023   K 4.0 09/03/2023  CL 106 09/03/2023   CREATININE 1.12 09/03/2023   BUN 20 09/03/2023   CO2 21 (L) 09/03/2023   PSA 1.0 11/22/2014   HGBA1C 9.1 (H) 05/23/2022   MICROALBUR 1.98 (H) 07/16/2013     Assessment & Plan:   Problem List Items Addressed This Visit       Other   Musculoskeletal pain - Primary   Advised heat.  Discontinuing cyclobenzaprine.  Starting baclofen and meloxicam.       Meds ordered this encounter  Medications   DISCONTD: cyclobenzaprine (FLEXERIL) 10 MG tablet    Sig: Take 1 tablet (10 mg total) by mouth 2 (two) times daily as needed for muscle spasms.    Dispense:  20 tablet    Refill:  0   baclofen (LIORESAL) 10 MG tablet    Sig: Take 0.5-1 tablets (5-10 mg total) by mouth 3 (three) times daily as needed.    Dispense:  30 each    Refill:  0   meloxicam (MOBIC) 15 MG tablet    Sig: Take 1 tablet (15 mg total) by mouth daily as needed for pain.    Dispense:  14 tablet    Refill:  0    Follow-up:  Return if symptoms worsen or fail to improve.  Everlene Other DO [facility]

## 2023-09-08 NOTE — Assessment & Plan Note (Signed)
Advised heat.  Discontinuing cyclobenzaprine.  Starting baclofen and meloxicam.

## 2023-12-09 DIAGNOSIS — T148XXA Other injury of unspecified body region, initial encounter: Secondary | ICD-10-CM | POA: Diagnosis not present

## 2023-12-09 DIAGNOSIS — S91301A Unspecified open wound, right foot, initial encounter: Secondary | ICD-10-CM | POA: Diagnosis not present

## 2023-12-09 DIAGNOSIS — Z6826 Body mass index (BMI) 26.0-26.9, adult: Secondary | ICD-10-CM | POA: Diagnosis not present

## 2024-02-22 ENCOUNTER — Telehealth: Payer: Self-pay

## 2024-02-22 MED ORDER — PANTOPRAZOLE SODIUM 40 MG PO TBEC: 40.0000 mg | DELAYED_RELEASE_TABLET | Freq: Every day | ORAL | 1 refills | Status: DC

## 2024-02-22 NOTE — Telephone Encounter (Signed)
 Prescription Request  02/22/2024  LOV: Visit date not found  What is the name of the medication or equipment? pantoprazole  (PROTONIX ) 40 MG tablet   Have you contacted your pharmacy to request a refill? Yes   Which pharmacy would you like this sent to?  [id number] - [place], [place] - [address] AT [facility] [address] [place] [address] Phone: [phone] Fax: [phone]    Patient notified that their request is being sent to the clinical staff for review and that they should receive a response within 2 business days.   Please advise at Mobile [phone] (mobile)

## 2024-05-03 DIAGNOSIS — R109 Unspecified abdominal pain: Secondary | ICD-10-CM | POA: Diagnosis not present

## 2024-05-03 DIAGNOSIS — R110 Nausea: Secondary | ICD-10-CM | POA: Diagnosis not present

## 2024-05-03 DIAGNOSIS — N132 Hydronephrosis with renal and ureteral calculous obstruction: Secondary | ICD-10-CM | POA: Diagnosis not present

## 2024-05-03 DIAGNOSIS — R300 Dysuria: Secondary | ICD-10-CM | POA: Diagnosis not present

## 2024-05-03 DIAGNOSIS — N23 Unspecified renal colic: Secondary | ICD-10-CM | POA: Diagnosis not present

## 2024-05-04 DIAGNOSIS — N132 Hydronephrosis with renal and ureteral calculous obstruction: Secondary | ICD-10-CM | POA: Diagnosis not present

## 2024-05-04 DIAGNOSIS — R109 Unspecified abdominal pain: Secondary | ICD-10-CM | POA: Diagnosis not present

## 2024-05-06 ENCOUNTER — Telehealth: Payer: Self-pay

## 2024-05-06 DIAGNOSIS — N200 Calculus of kidney: Secondary | ICD-10-CM

## 2024-05-06 NOTE — Telephone Encounter (Signed)
 Call [phone]

## 2024-05-06 NOTE — Telephone Encounter (Signed)
 Called pt to advise him of MD McKenzie verbal recommendation pt did not answer lvm w/ Safwan to have pt call back for details due to no DPR on file

## 2024-05-06 NOTE — Telephone Encounter (Signed)
 Returned phone call to pt did not answer lvm to call back

## 2024-05-06 NOTE — Telephone Encounter (Signed)
 Patient left a voice message.  A Dr. Sherrilee patient. Has a 9 mm kidney stone. Needing an appointment.  Please advise.  Call:  [phone]

## 2024-05-07 ENCOUNTER — Ambulatory Visit (HOSPITAL_COMMUNITY)
Admission: RE | Admit: 2024-05-07 | Discharge: 2024-05-07 | Disposition: A | Discharge status: Home / Self Care | Source: Ambulatory Visit | Attending: Urology | Admitting: Urology

## 2024-05-07 ENCOUNTER — Telehealth: Payer: Self-pay

## 2024-05-07 DIAGNOSIS — N200 Calculus of kidney: Secondary | ICD-10-CM | POA: Diagnosis not present

## 2024-05-07 DIAGNOSIS — I878 Other specified disorders of veins: Secondary | ICD-10-CM | POA: Diagnosis not present

## 2024-05-07 NOTE — Telephone Encounter (Signed)
 Patient has called multiple times requesting appointment. Please call [phone]

## 2024-05-07 NOTE — Telephone Encounter (Signed)
 Return call to patient from hospital follow up. Pt is made aware to go get KUB and once received it will be routed to Dr. Sherrilee on how to proceed. Verbalized understanding

## 2024-05-07 NOTE — Telephone Encounter (Signed)
 Called pt to make him aware to get a KUB after hospital visited on 09/20 with no answer, left vm for return call.

## 2024-05-08 NOTE — Telephone Encounter (Signed)
 Patient went for Xray and needs appt asap

## 2024-05-08 NOTE — Telephone Encounter (Signed)
 Return call to pt. Pt is made aware that KUB has not been read by radiology as of yet, once read, it will be routed to MD for review and someone will call pt with MD recommendation. Verbalized understanding.

## 2024-05-08 NOTE — Telephone Encounter (Signed)
 Returned phone call to pt pt wife answered the phone no DPR on file for pt wife advised pt wife to have pt call back pt wife gave pt updated telephone number [phone] attempted to call pt, lvm to c/b for an update

## 2024-05-09 ENCOUNTER — Other Ambulatory Visit: Payer: Self-pay

## 2024-05-09 ENCOUNTER — Other Ambulatory Visit: Payer: Self-pay | Admitting: Urology

## 2024-05-09 DIAGNOSIS — N200 Calculus of kidney: Secondary | ICD-10-CM

## 2024-05-09 MED ORDER — OXYCODONE-ACETAMINOPHEN 5-325 MG PO TABS: 1.0000 | ORAL_TABLET | ORAL | 0 refills | Status: AC | PRN

## 2024-05-09 NOTE — Telephone Encounter (Signed)
 Patient mom came into the office today with general questions/concerns regarding needing appt for kidney stone. Would like to be seen before the weekend. Is in severe pain Patient may be reached at [phone] to discuss questions.

## 2024-05-09 NOTE — Telephone Encounter (Signed)
 Patient would like RX sent for Flomax  also

## 2024-05-09 NOTE — Telephone Encounter (Signed)
Patient scheduled 10/1

## 2024-05-10 MED ORDER — TAMSULOSIN HCL 0.4 MG PO CAPS
0.4000 mg | ORAL_CAPSULE | Freq: Every day | ORAL | 1 refills | Status: AC
Start: 2024-05-10 — End: ?

## 2024-05-10 NOTE — Telephone Encounter (Signed)
 Verbal from Dr. Sherrilee to send in flomax . Pt made aware and verbalized understanding

## 2024-05-14 NOTE — Telephone Encounter (Signed)
 Tried calling pt with no answer, left detailed vm making him aware.

## 2024-05-15 ENCOUNTER — Ambulatory Visit: Admitting: Urology

## 2024-05-15 VITALS — BP 128/76 | HR 60

## 2024-05-15 DIAGNOSIS — N200 Calculus of kidney: Secondary | ICD-10-CM | POA: Diagnosis not present

## 2024-05-15 MED ORDER — SODIUM BICARBONATE 650 MG PO TABS: 650.0000 mg | ORAL_TABLET | Freq: Two times a day (BID) | ORAL | 11 refills | Status: AC

## 2024-05-15 MED ORDER — ALFUZOSIN HCL ER 10 MG PO TB24: 10.0000 mg | ORAL_TABLET | Freq: Every day | ORAL | 11 refills | Status: AC

## 2024-05-15 NOTE — Progress Notes (Signed)
 05/15/2024 11:10 AM   Rodney Meyers Dec 24, 1955 [id number]  Referring provider: Cook, Jayce G, DO [address] [place],  [place] [address]  Followup nephrolithiasis   HPI: Rodney Meyers is a 68yo here for followup for nephrolithiasis. He presented to the ER 9/20 and was diagnosed with a 9mm right proximal ureteral calculus. He has not passed the calculus. Stone no seen on KUB from 9/23. He denies any flank pain currently. No hematuria. No significant LUTS   PMH: Past Medical History:  Diagnosis Date   Asthma    Diabetes mellitus without complication (HCC)    Kidney stone     Surgical History: Past Surgical History:  Procedure Laterality Date   L. arm surgery      Home Medications:  Allergies as of 05/15/2024   No Known Allergies      Medication List        Accurate as of May 15, 2024 11:10 AM. If you have any questions, ask your nurse or doctor.          albuterol  108 (90 Base) MCG/ACT inhaler Commonly known as: VENTOLIN  HFA Inhale 2 puffs into the lungs every 4 (four) hours as needed for wheezing or shortness of breath.   baclofen  10 MG tablet Commonly known as: LIORESAL  Take 0.5-1 tablets (5-10 mg total) by mouth 3 (three) times daily as needed.   ipratropium 0.03 % nasal spray Commonly known as: ATROVENT  Place 2 sprays into both nostrils 2 (two) times daily.   meloxicam  15 MG tablet Commonly known as: MOBIC  Take 1 tablet (15 mg total) by mouth daily as needed for pain.   metFORMIN  500 MG tablet Commonly known as: GLUCOPHAGE  TAKE 1 TABLET BY MOUTH 2 TIMES DAILY WITH A MEAL.   ondansetron  4 MG disintegrating tablet Commonly known as: ZOFRAN -ODT Take 1 tablet (4 mg total) by mouth every 8 (eight) hours as needed.   oxyCODONE -acetaminophen  5-325 MG tablet Commonly known as: Percocet Take 1 tablet by mouth every 4 (four) hours as needed.   pantoprazole  40 MG tablet Commonly known as: PROTONIX  Take 1 tablet (40 mg total) by mouth  daily.   polyethylene glycol powder 17 GM/SCOOP powder Commonly known as: MiraLax  Take 1 scoop (17 Gm) by mouth daily for constipation.  May mix with water, juice or milk.   tamsulosin  0.4 MG Caps capsule Commonly known as: FLOMAX  Take 1 capsule (0.4 mg total) by mouth daily.        Allergies: No Known Allergies  Family History: Family History  Problem Relation Age of Onset   Heart attack Father     Social History:  reports that he has never smoked. He has never used smokeless tobacco. He reports that he does not drink alcohol and does not use drugs.  ROS: All other review of systems were reviewed and are negative except what is noted above in HPI  Physical Exam: BP 128/76   Pulse 60   Constitutional:  Alert and oriented, No acute distress. HEENT: [place] AT, moist mucus membranes.  Trachea midline, no masses. Cardiovascular: No clubbing, cyanosis, or edema. Respiratory: Normal respiratory effort, no increased work of breathing. GI: Abdomen is soft, nontender, nondistended, no abdominal masses GU: No CVA tenderness.  Lymph: No cervical or inguinal lymphadenopathy. Skin: No rashes, bruises or suspicious lesions. Neurologic: Grossly intact, no focal deficits, moving all 4 extremities. Psychiatric: Normal mood and affect.  Laboratory Data: Lab Results  Component Value Date   WBC 6.6 09/03/2023   HGB  13.4 09/03/2023   HCT 41.9 09/03/2023   MCV 81.8 09/03/2023   PLT 210 09/03/2023    Lab Results  Component Value Date   CREATININE 1.12 09/03/2023    Lab Results  Component Value Date   PSA 1.0 11/22/2014   PSA 0.56 07/16/2013    No results found for: TESTOSTERONE  Lab Results  Component Value Date   HGBA1C 9.1 (H) 05/23/2022    Urinalysis    Component Value Date/Time   APPEARANCEUR Clear 08/24/2023 1521   GLUCOSEU 2+ (A) 08/24/2023 1521   BILIRUBINUR Negative 08/24/2023 1521   KETONESUR trace (5) (A) 01/26/2023 0821   PROTEINUR Negative 08/24/2023  1521   UROBILINOGEN 0.2 01/26/2023 0821   NITRITE Negative 08/24/2023 1521   LEUKOCYTESUR Negative 08/24/2023 1521    Lab Results  Component Value Date   LABMICR Comment 08/24/2023   WBCUA 0-5 02/22/2023   LABEPIT 0-10 02/22/2023   BACTERIA Few (A) 02/22/2023    Pertinent Imaging: KUb 05/07/24: Images reviewed and discussed with the patient  Results for orders placed during the hospital encounter of 05/07/24  DG Abd 1 View  Narrative CLINICAL DATA:  Kidney stones  EXAM: ABDOMEN - 1 VIEW  COMPARISON:  January 31, 2023, February 01, 2023  FINDINGS: Air and stool-filled nondilated loops of bowel. Moderate colonic stool burden diffusely throughout the colon. Renal contours are partially obscured by overlapping bowel contents. 2 mm radiopaque density projecting over the superior pole of LEFT kidney consistent with a nonobstructing nephrolithiasis. Pelvic phleboliths. Mild degenerative changes of the lumbar spine.  IMPRESSION: Nonobstructing LEFT nephrolithiasis. Renal contours are relatively obscured by overlapping bowel contents.  If there is a persistent clinical concern for obstructing nephrolithiasis, dedicated CT abdomen pelvis would be recommended.   Electronically Signed By: Corean Salter M.D. On: 05/13/2024 08:44  No results found for this or any previous visit.  No results found for this or any previous visit.  No results found for this or any previous visit.  No results found for this or any previous visit.  No results found for this or any previous visit.  No results found for this or any previous visit.  Results for orders placed in visit on 02/01/23  CT RENAL STONE STUDY  Narrative CLINICAL DATA:  Right flank pain.  EXAM: CT ABDOMEN AND PELVIS WITHOUT CONTRAST  TECHNIQUE: Multidetector CT imaging of the abdomen and pelvis was performed following the standard protocol without IV contrast.  RADIATION DOSE REDUCTION: This exam was performed  according to the departmental dose-optimization program which includes automated exposure control, adjustment of the mA and/or kV according to patient size and/or use of iterative reconstruction technique.  COMPARISON:  CT scan 03/27/2020  FINDINGS: Lower chest: The lung bases are clear of acute process. No pleural effusion or pulmonary lesions. The heart is normal in size. No pericardial effusion. The distal esophagus and aorta are unremarkable.  Hepatobiliary: Diffuse fatty infiltration of the liver but no hepatic lesions or intrahepatic biliary dilatation. The gallbladder is contracted. No common bile duct dilatation.  Pancreas: Normal  Spleen: Normal  Adrenals/Urinary Tract: The adrenal glands are normal. Small lower pole left renal calculus. No right-sided renal calculi. No hydroureter or ureteral calculi. There is a 6 mm calculus in the central posterior bladder which could be at the left UVJ but is not causing obstruction. No renal or bladder lesions are identified without contrast.  Stomach/Bowel: No significant findings. The terminal ileum and appendix are normal. Scattered sigmoid colon diverticulosis.  Vascular/Lymphatic:  The aorta is normal in caliber. Minimal atheroscerlotic calcifications. No mesenteric of retroperitoneal mass or adenopathy. Small scattered lymph nodes are noted.  Reproductive: The prostate gland seminal vesicles are unremarkable.  Other: No pelvic mass or adenopathy. No free pelvic fluid collections. No inguinal mass or adenopathy. No abdominal wall hernia or subcutaneous lesions.  Musculoskeletal: No significant bony findings.  IMPRESSION: 1. 6 mm calculus in the bladder which could be at the left UVJ but is not causing obstruction. 2. Small lower pole left renal calculus. 3. Diffuse fatty infiltration of the liver.   Electronically Signed By: MYRTIS Stammer M.D. On: 02/01/2023 17:39   Assessment & Plan:    1. Kidney stones  (Primary) KUB today, will call with results -sodium bicarbonate 650mg  BID  - Urinalysis, Routine w reflex microscopic   No follow-ups on file.  Belvie Clara, MD  [facility] [place]

## 2024-05-21 ENCOUNTER — Encounter: Payer: Self-pay | Admitting: Urology

## 2024-05-21 NOTE — Patient Instructions (Signed)

## 2024-06-14 ENCOUNTER — Ambulatory Visit (HOSPITAL_COMMUNITY): Attending: Urology

## 2024-07-24 ENCOUNTER — Ambulatory Visit: Admitting: Urology

## 2024-08-15 ENCOUNTER — Other Ambulatory Visit: Payer: Self-pay | Admitting: Family Medicine

## 2024-08-29 ENCOUNTER — Ambulatory Visit: Payer: BC Managed Care – PPO | Admitting: Urology
